# Patient Record
Sex: Male | Born: 1945 | Race: Black or African American | Hispanic: No | Marital: Married | Smoking: Former smoker
Health system: Southern US, Community
[De-identification: ages and names within clinical notes are randomized; demographics above are authoritative.]

## PROBLEM LIST (undated history)

## (undated) DIAGNOSIS — J9610 Chronic respiratory failure, unspecified whether with hypoxia or hypercapnia: Secondary | ICD-10-CM

## (undated) DIAGNOSIS — J449 Chronic obstructive pulmonary disease, unspecified: Secondary | ICD-10-CM

## (undated) DIAGNOSIS — K219 Gastro-esophageal reflux disease without esophagitis: Secondary | ICD-10-CM

## (undated) DIAGNOSIS — H409 Unspecified glaucoma: Secondary | ICD-10-CM

## (undated) DIAGNOSIS — I10 Essential (primary) hypertension: Secondary | ICD-10-CM

## (undated) DIAGNOSIS — I509 Heart failure, unspecified: Secondary | ICD-10-CM

## (undated) HISTORY — PX: JOINT REPLACEMENT: SHX530

## (undated) HISTORY — PX: TOTAL KNEE ARTHROPLASTY: SHX125

---

## 2004-04-26 ENCOUNTER — Emergency Department: Payer: Self-pay | Admitting: Unknown Physician Specialty

## 2004-07-08 ENCOUNTER — Other Ambulatory Visit: Payer: Self-pay

## 2004-08-20 ENCOUNTER — Ambulatory Visit: Payer: Self-pay | Admitting: Orthopedic Surgery

## 2005-04-09 ENCOUNTER — Ambulatory Visit: Payer: Self-pay | Admitting: Internal Medicine

## 2005-05-05 ENCOUNTER — Inpatient Hospital Stay: Payer: Self-pay | Admitting: Orthopedic Surgery

## 2006-10-26 ENCOUNTER — Ambulatory Visit: Payer: Self-pay | Admitting: Cardiovascular Disease

## 2007-10-27 ENCOUNTER — Ambulatory Visit: Payer: Self-pay | Admitting: Family Medicine

## 2007-12-01 ENCOUNTER — Ambulatory Visit: Payer: Self-pay | Admitting: Orthopedic Surgery

## 2007-12-05 ENCOUNTER — Ambulatory Visit: Payer: Self-pay | Admitting: Orthopedic Surgery

## 2011-05-14 ENCOUNTER — Inpatient Hospital Stay: Payer: Self-pay | Admitting: Internal Medicine

## 2011-05-28 ENCOUNTER — Ambulatory Visit: Payer: Self-pay | Admitting: Internal Medicine

## 2012-05-31 ENCOUNTER — Ambulatory Visit: Payer: Self-pay | Admitting: Family Medicine

## 2012-06-15 ENCOUNTER — Ambulatory Visit: Payer: Self-pay | Admitting: Family Medicine

## 2012-07-08 ENCOUNTER — Ambulatory Visit: Payer: Self-pay | Admitting: Internal Medicine

## 2013-04-04 ENCOUNTER — Emergency Department: Payer: Self-pay | Admitting: Emergency Medicine

## 2013-04-05 ENCOUNTER — Emergency Department: Payer: Self-pay

## 2013-04-05 LAB — COMPREHENSIVE METABOLIC PANEL
Albumin: 3.6 g/dL (ref 3.4–5.0)
Alkaline Phosphatase: 97 U/L (ref 50–136)
BUN: 11 mg/dL (ref 7–18)
Bilirubin,Total: 0.3 mg/dL (ref 0.2–1.0)
Calcium, Total: 9.5 mg/dL (ref 8.5–10.1)
Chloride: 103 mmol/L (ref 98–107)
Creatinine: 1.02 mg/dL (ref 0.60–1.30)
EGFR (African American): >60
EGFR (Non-African Amer.): >60
Glucose: 103 mg/dL — ABNORMAL HIGH (ref 65–99)
Potassium: 4.4 mmol/L (ref 3.5–5.1)
SGOT(AST): 17 U/L (ref 15–37)
SGPT (ALT): 23 U/L (ref 12–78)
Total Protein: 7.7 g/dL (ref 6.4–8.2)

## 2013-04-05 LAB — CBC WITH DIFFERENTIAL/PLATELET
Basophil #: 0.1 10*3/uL (ref 0.0–0.1)
Basophil %: 1 %
Lymphocyte %: 23.1 %
MCV: 86 fL (ref 80–100)
Monocyte #: 0.8 x10 3/mm (ref 0.2–1.0)
Monocyte %: 13.1 %
WBC: 6.3 10*3/uL (ref 3.8–10.6)

## 2013-04-05 LAB — TSH: Thyroid Stimulating Horm: 1.37 u[IU]/mL

## 2013-06-27 ENCOUNTER — Ambulatory Visit: Payer: Self-pay | Admitting: Family Medicine

## 2013-07-24 ENCOUNTER — Ambulatory Visit: Payer: Self-pay | Admitting: Specialist

## 2013-08-24 ENCOUNTER — Inpatient Hospital Stay: Payer: Self-pay | Admitting: Specialist

## 2013-08-24 LAB — COMPREHENSIVE METABOLIC PANEL
ALBUMIN: 3.3 g/dL — AB (ref 3.4–5.0)
Alkaline Phosphatase: 74 U/L
BUN: 7 mg/dL (ref 7–18)
Bilirubin,Total: 0.5 mg/dL (ref 0.2–1.0)
CREATININE: 0.83 mg/dL (ref 0.60–1.30)
Calcium, Total: 8.4 mg/dL — ABNORMAL LOW (ref 8.5–10.1)
Chloride: 99 mmol/L (ref 98–107)
Co2: 39 mmol/L — ABNORMAL HIGH (ref 21–32)
EGFR (Non-African Amer.): >60
Glucose: 101 mg/dL — ABNORMAL HIGH (ref 65–99)
Osmolality: 272 (ref 275–301)
POTASSIUM: 4 mmol/L (ref 3.5–5.1)
SGOT(AST): 23 U/L (ref 15–37)
SGPT (ALT): 15 U/L (ref 12–78)
Sodium: 137 mmol/L (ref 136–145)
TOTAL PROTEIN: 7.4 g/dL (ref 6.4–8.2)

## 2013-08-24 LAB — CBC
HCT: 37.9 % — ABNORMAL LOW (ref 40.0–52.0)
HGB: 11.9 g/dL — ABNORMAL LOW (ref 13.0–18.0)
MCH: 27.3 pg (ref 26.0–34.0)
MCHC: 31.4 g/dL — ABNORMAL LOW (ref 32.0–36.0)
MCV: 87 fL (ref 80–100)
PLATELETS: 205 10*3/uL (ref 150–440)
RBC: 4.36 10*6/uL — ABNORMAL LOW (ref 4.40–5.90)
RDW: 14.9 % — AB (ref 11.5–14.5)
WBC: 5.3 10*3/uL (ref 3.8–10.6)

## 2013-08-24 LAB — CK TOTAL AND CKMB (NOT AT ARMC)
CK, Total: 131 U/L
CK-MB: 2.2 ng/mL (ref 0.5–3.6)

## 2013-08-24 LAB — TROPONIN I
TROPONIN-I: 0.2 ng/mL — AB
Troponin-I: 0.2 ng/mL — ABNORMAL HIGH
Troponin-I: 0.2 ng/mL — ABNORMAL HIGH

## 2013-08-24 LAB — PRO B NATRIURETIC PEPTIDE: B-TYPE NATIURETIC PEPTID: 625 pg/mL — AB (ref 0–125)

## 2013-08-25 LAB — CBC WITH DIFFERENTIAL/PLATELET
BASOS ABS: 0 10*3/uL (ref 0.0–0.1)
Basophil %: 0.7 %
EOS ABS: 0 10*3/uL (ref 0.0–0.7)
Eosinophil %: 0 %
HCT: 41.2 % (ref 40.0–52.0)
HGB: 12.8 g/dL — ABNORMAL LOW (ref 13.0–18.0)
LYMPHS ABS: 0.5 10*3/uL — AB (ref 1.0–3.6)
Lymphocyte %: 8.2 %
MCH: 26.9 pg (ref 26.0–34.0)
MCHC: 31 g/dL — AB (ref 32.0–36.0)
MCV: 87 fL (ref 80–100)
MONO ABS: 0.1 x10 3/mm — AB (ref 0.2–1.0)
MONOS PCT: 1 %
Neutrophil #: 5.2 10*3/uL (ref 1.4–6.5)
Neutrophil %: 90.1 %
PLATELETS: 254 10*3/uL (ref 150–440)
RBC: 4.75 10*6/uL (ref 4.40–5.90)
RDW: 14.8 % — ABNORMAL HIGH (ref 11.5–14.5)
WBC: 5.8 10*3/uL (ref 3.8–10.6)

## 2013-08-25 LAB — BASIC METABOLIC PANEL
Anion Gap: 2 — ABNORMAL LOW (ref 7–16)
BUN: 11 mg/dL (ref 7–18)
CALCIUM: 8.9 mg/dL (ref 8.5–10.1)
Chloride: 97 mmol/L — ABNORMAL LOW (ref 98–107)
Co2: 38 mmol/L — ABNORMAL HIGH (ref 21–32)
Creatinine: 0.89 mg/dL (ref 0.60–1.30)
EGFR (African American): >60
EGFR (Non-African Amer.): >60
Glucose: 171 mg/dL — ABNORMAL HIGH (ref 65–99)
Osmolality: 277 (ref 275–301)
POTASSIUM: 4.3 mmol/L (ref 3.5–5.1)
Sodium: 137 mmol/L (ref 136–145)

## 2013-08-29 LAB — CULTURE, BLOOD (SINGLE)

## 2013-08-30 LAB — EXPECTORATED SPUTUM ASSESSMENT W GRAM STAIN, RFLX TO RESP C

## 2013-09-27 ENCOUNTER — Ambulatory Visit: Payer: Self-pay | Admitting: Gastroenterology

## 2013-10-06 LAB — PATHOLOGY REPORT

## 2013-12-17 ENCOUNTER — Emergency Department: Payer: Self-pay | Admitting: Emergency Medicine

## 2013-12-17 LAB — URINALYSIS, COMPLETE
BACTERIA: NONE SEEN
Bilirubin,UR: NEGATIVE
Blood: NEGATIVE
GLUCOSE, UR: NEGATIVE mg/dL (ref 0–75)
Ketone: NEGATIVE
Leukocyte Esterase: NEGATIVE
NITRITE: NEGATIVE
PH: 8 (ref 4.5–8.0)
PROTEIN: NEGATIVE
RBC,UR: 2 /HPF (ref 0–5)
Specific Gravity: 1.006 (ref 1.003–1.030)
WBC UR: <1 /HPF (ref 0–5)

## 2013-12-17 LAB — COMPREHENSIVE METABOLIC PANEL
ANION GAP: 4 — AB (ref 7–16)
AST: 14 U/L — AB (ref 15–37)
Albumin: 3.5 g/dL (ref 3.4–5.0)
Alkaline Phosphatase: 69 U/L
BUN: 11 mg/dL (ref 7–18)
Bilirubin,Total: 0.5 mg/dL (ref 0.2–1.0)
CO2: 34 mmol/L — AB (ref 21–32)
CREATININE: 0.8 mg/dL (ref 0.60–1.30)
Calcium, Total: 8.8 mg/dL (ref 8.5–10.1)
Chloride: 102 mmol/L (ref 98–107)
EGFR (African American): >60
EGFR (Non-African Amer.): >60
Glucose: 101 mg/dL — ABNORMAL HIGH (ref 65–99)
Osmolality: 279 (ref 275–301)
Potassium: 4 mmol/L (ref 3.5–5.1)
SGPT (ALT): 16 U/L (ref 12–78)
SODIUM: 140 mmol/L (ref 136–145)
Total Protein: 7.3 g/dL (ref 6.4–8.2)

## 2013-12-17 LAB — CBC
HCT: 41.5 % (ref 40.0–52.0)
HGB: 13.1 g/dL (ref 13.0–18.0)
MCH: 27.2 pg (ref 26.0–34.0)
MCHC: 31.6 g/dL — AB (ref 32.0–36.0)
MCV: 86 fL (ref 80–100)
PLATELETS: 204 10*3/uL (ref 150–440)
RBC: 4.84 10*6/uL (ref 4.40–5.90)
RDW: 14.3 % (ref 11.5–14.5)
WBC: 4.5 10*3/uL (ref 3.8–10.6)

## 2013-12-17 LAB — PROTIME-INR
INR: 1
Prothrombin Time: 13.6 secs (ref 11.5–14.7)

## 2013-12-17 LAB — LIPASE, BLOOD: Lipase: 182 U/L (ref 73–393)

## 2014-08-06 ENCOUNTER — Emergency Department: Payer: Self-pay | Admitting: Emergency Medicine

## 2014-08-06 LAB — BASIC METABOLIC PANEL
Anion Gap: 3 — ABNORMAL LOW (ref 7–16)
BUN: 11 mg/dL (ref 7–18)
Calcium, Total: 8.4 mg/dL — ABNORMAL LOW (ref 8.5–10.1)
Chloride: 101 mmol/L (ref 98–107)
Co2: 38 mmol/L — ABNORMAL HIGH (ref 21–32)
Creatinine: 0.84 mg/dL (ref 0.60–1.30)
EGFR (African American): >60
GLUCOSE: 98 mg/dL (ref 65–99)
OSMOLALITY: 282 (ref 275–301)
Potassium: 4.4 mmol/L (ref 3.5–5.1)
Sodium: 142 mmol/L (ref 136–145)

## 2014-08-06 LAB — CBC
HCT: 40.1 % (ref 40.0–52.0)
HGB: 12.3 g/dL — ABNORMAL LOW (ref 13.0–18.0)
MCH: 27.2 pg (ref 26.0–34.0)
MCHC: 30.8 g/dL — AB (ref 32.0–36.0)
MCV: 88 fL (ref 80–100)
PLATELETS: 218 10*3/uL (ref 150–440)
RBC: 4.54 10*6/uL (ref 4.40–5.90)
RDW: 15.1 % — ABNORMAL HIGH (ref 11.5–14.5)
WBC: 6 10*3/uL (ref 3.8–10.6)

## 2014-08-06 LAB — TROPONIN I: TROPONIN-I: 0.18 ng/mL — AB

## 2014-08-26 LAB — BASIC METABOLIC PANEL
ANION GAP: 2 — AB (ref 7–16)
BUN: 14 mg/dL (ref 7–18)
CREATININE: 0.84 mg/dL (ref 0.60–1.30)
Calcium, Total: 9 mg/dL (ref 8.5–10.1)
Chloride: 98 mmol/L (ref 98–107)
Co2: 39 mmol/L — ABNORMAL HIGH (ref 21–32)
EGFR (Non-African Amer.): >60
Glucose: 86 mg/dL (ref 65–99)
Osmolality: 277 (ref 275–301)
Potassium: 4.3 mmol/L (ref 3.5–5.1)
Sodium: 139 mmol/L (ref 136–145)

## 2014-08-26 LAB — CBC
HCT: 41.1 % (ref 40.0–52.0)
HGB: 12.8 g/dL — AB (ref 13.0–18.0)
MCH: 27 pg (ref 26.0–34.0)
MCHC: 31 g/dL — ABNORMAL LOW (ref 32.0–36.0)
MCV: 87 fL (ref 80–100)
Platelet: 213 10*3/uL (ref 150–440)
RBC: 4.72 10*6/uL (ref 4.40–5.90)
RDW: 14.7 % — ABNORMAL HIGH (ref 11.5–14.5)
WBC: 6.1 10*3/uL (ref 3.8–10.6)

## 2014-08-26 LAB — PRO B NATRIURETIC PEPTIDE: B-TYPE NATIURETIC PEPTID: 510 pg/mL — AB (ref 0–125)

## 2014-08-26 LAB — TROPONIN I: Troponin-I: 0.19 ng/mL — ABNORMAL HIGH

## 2014-08-27 ENCOUNTER — Observation Stay: Payer: Self-pay | Admitting: Internal Medicine

## 2014-08-27 LAB — TROPONIN I
TROPONIN-I: 0.18 ng/mL — AB
Troponin-I: 0.18 ng/mL — ABNORMAL HIGH

## 2014-08-27 LAB — HEMOGLOBIN A1C: HEMOGLOBIN A1C: 6.4 % — AB (ref 4.2–6.3)

## 2014-11-10 NOTE — H&P (Signed)
PATIENT NAME:  Manuel Murphy, LAMPING MR#:  [id number] DATE OF BIRTH:  1945-09-16  DATE OF ADMISSION:  08/24/2013  PRIMARY CARE PHYSICIAN:  At [facility].   CHIEF COMPLAINT:  Shortness of breath and cough.   HISTORY OF PRESENT ILLNESS:  This is a 69 year old male who presents to the hospital due to shortness of breath, cough progressively getting worse over the past week to 10 days. The patient says that his breathing first was worse with exertion but now it is also at rest. The patient admits to a cough, which is productive with clear sputum. He denies any fevers, admits to some chills but no nausea, no vomiting. No chest pain. No abdominal pain. No diarrhea. No other associated symptoms. The patient went to see his primary care physician at [facility]; they referred him to the ER for further evaluation as the patient was having significant wheezing bilaterally. In the Emergency Room, the patient was noted to have some COPD exacerbation along with possible underlying bronchitis and pneumonia. He also was noted to have elevated troponin at 0.6. Hospitalist services were contacted for further treatment and evaluation.   REVIEW OF SYSTEMS: CONSTITUTIONAL:  No documented fever. No weight gain or weight loss.  EYES:  No blurred or double vision.  ENT:  No tinnitus or postnasal drip. No redness of the oropharynx.  RESPIRATORY:  Positive cough. Positive wheeze. No hemoptysis. Positive COPD.  CARDIOVASCULAR:  No chest pain. No orthopnea, no palpitations, no syncope.  GASTROINTESTINAL:  No nausea. No vomiting. No diarrhea. No abdominal pain. No melena or hematochezia.  GENITOURINARY:  No dysuria or hematuria.  ENDOCRINE:  No polyuria or nocturia. Heat or cold intolerance.  HEMATOLOGIC:  No anemia. No bruising. No bleeding.  INTEGUMENTARY:  No rashes. No lesions.  MUSCULOSKELETAL:  No arthritis. No swelling. No gout.  NEUROLOGIC:  No numbness or tingling. No ataxia. No seizure activity.  PSYCHIATRIC:  No  anxiety, no insomnia. No ADD.   PAST MEDICAL HISTORY:  Consistent with COPD, oxygen dependent, hypertension, glaucoma, a history of CHF, hyperlipidemia.   ALLERGIES:  MORPHINE.   SOCIAL HISTORY:  Used to be a smoker, quit about 10+ years ago. Does have a 30 to 40 pack-year smoking history. No alcohol abuse. No illicit drug abuse. Lives at home with his wife.   FAMILY HISTORY:  Both mother and father are deceased. Mother died from complications of heart disease and MRI. Father died from complications of congestive heart failure.   CURRENT MEDICATIONS:  As follows:  1.  Advair 250/50 1 puff b.i.d. 2.  Aspirin 81 mg daily.  3.  Atorvastatin 40 mg daily.  4.  Brimonidine eye drops 0.15 ophthalmic solution b.i.d. 5.  Cetirizine 10 mg daily.  6.  Combivent 1 puff q.6 hours.  7.  Imdur 30 mg daily.  8.  Lasix 40 mg daily.  9.  Latanoprost eye drops 1 drop to each eye at bedtime 10.  ProAir, albuterol inhaler 2 puffs q.4 hours as needed.  11.  Quinapril 40 mg b.i.d. 12.  Spiriva 1 puff daily. 13.  Travatan 0.004% ophthalmic solution to the affected eye daily in the evening.   PHYSICAL EXAMINATION: VITAL SIGNS:  Temperature 98.6, pulse 74, respirations 18, blood pressure 119/74, sats 96% on 2 L nasal cannula.  GENERAL:  He is pleasant-appearing male in no apparent distress.  HEENT:  Atraumatic, normocephalic. Extraocular muscles are intact. Pupils are equal and reactive to light. Sclerae anicteric. No conjunctival injection. No pharyngeal erythema.  NECK:  Supple. No jugular venous distention. No bruits. No lymphadenopathy or thyromegaly.  HEART:  Regular rate and rhythm. No murmurs, no rubs, no clicks.  LUNGS:  Diffuse coarse wheezing bilaterally, also diffuse rhonchi bilaterally. Negative use of accessory muscles. No dullness to percussion.  ABDOMEN:  Soft, flat, nontender, nondistended. Has good bowel sounds. No hepatosplenomegaly appreciated.  EXTREMITIES:  No evidence of any cyanosis,  clubbing or peripheral edema. Has +2 pedal and radial pulses bilaterally.  NEUROLOGICAL:  Alert, awake, and oriented x 3 with no focal motor or sensory deficits appreciated bilaterally.  SKIN:  Moist and warm with no rashes appreciated.  LYMPHATIC:  No cervical or axillary lymphadenopathy.   LABORATORY DATA:  Serum glucose of 101, BUN 7, creatinine 0.8, sodium 137, potassium 4.0, chloride 99, bicarb 39. LFTs were within normal limits. Troponin 0.2. White cell count 5.3, hemoglobin 11.9, hematocrit 37.9, platelet count 205. The patient had a chest x-ray done, which showed diffuse peribronchial cuffing which may suggest bronchitis. In addition, there is ill-defined right mid lung opacity, which may reflect developing airspace consolidation in the right upper lobe or superior segment of the right lower lobe.   ASSESSMENT AND PLAN:  This is a 69 year old male with chronic obstructive pulmonary disease, hypertension, glaucoma, a history of congestive heart failure, hyperlipidemia who presents to the hospital due to shortness of breath and cough progressively getting worse over the past 10 days to 2 weeks and noted to be in chronic obstructive pulmonary disease exacerbation.  1.  Chronic obstructive pulmonary disease exacerbation. This is likely the cause of the patient's progressive shortness of breath and cough. This is likely secondary to underlying acute bronchitis/early pneumonia. I will start treatment with IV steroids, around-the-clock nebulizer treatments, continue his Advair and Spiriva, start him on empiric ceftriaxone and , follow sputum and blood cultures. The patient already is on home oxygen.  2.  Acute bronchitis/pneumonia as seen on chest x-ray done on admission. I will treat him with IV ceftriaxone and Zithromax and follow sputum and blood cultures.  3.  Elevated troponin. This is likely demand ischemia from chronic obstructive pulmonary disease and hypoxia. The patient is active. No chest  pain. No EKG changes. We will observe on telemetry, follow serial cardiac enzymes, continue aspirin and statin.  4.  Hyperlipidemia. We will continue atorvastatin.  5.  Glaucoma. Continue Travatan, latanoprost and brimonidine eye drops.   CODE STATUS:  The patient is a FULL CODE.   TIME SPENT:  50 minutes.   ____________________________ Belia Heman. Verdell Carmine, MD vjs:jm D: 08/24/2013 12:20:41 ET T: 08/24/2013 12:42:07 ET JOB#: [id number]  cc: Belia Heman. Verdell Carmine, MD, <Dictator> Henreitta Leber MD ELECTRONICALLY SIGNED 09/02/2013 17:54

## 2014-11-10 NOTE — Discharge Summary (Signed)
PATIENT NAME:  Manuel Murphy, Manuel Murphy MR#:  [id number] DATE OF BIRTH:  [birth year]  DATE OF ADMISSION:  08/24/2013 DATE OF DISCHARGE:  08/26/2013   ADMISSION DIAGNOSIS: Acute bronchitis.  DISCHARGE DIAGNOSES: 1. Acute chronic obstructive pulmonary disease exacerbation.  2. Community-acquired pneumonia.  3. Elevated troponin.  4. Hyperlipidemia.  5. Glaucoma.  6. Gastroesophageal reflux disease.   CONSULTATIONS: Speech.   PERTINENT LABORATORIES AT DISCHARGE: Blood cultures were negative to date. White blood cells 5.8, hemoglobin 12.8, hematocrit 41, platelets of 254. Sodium 137, potassium 4.3, chloride 97, bicarbonate 38, BUN 11, creatinine 0.89, glucose is 171. Troponins x3 were 0.20.   HOSPITAL COURSE: A 69 year old male who presented with shortness of breath, found to have acute chronic obstructive pulmonary disease exacerbation and also a right middle lobe pneumonia. For further details, please refer to the H and P.   1. Acute chronic obstructive pulmonary disease exacerbation, likely secondary to community-acquired pneumonia. The patient was started on IV steroids, inhalers, nebulizers. He is on oxygen at home, on which he was continued. He is actually doing quite well. At discharge, he has some minimal crackles at the right base, but no wheezing and good airflow. He will remain on his oxygen, antibiotic and inhalers.  2. Community-acquired pneumonia. This is a right middle lobe pneumonia, so I got speech therapy in consultation, and they thought that maybe he had some reflux issues contributing to his pneumonia, and it was recommended that he have an outpatient barium test to evaluate for reflux. This can be arranged by his primary care physician. He is placed on a PPI as well as antibiotics for his pneumonia. From a pneumonia perspective, he is doing well. He has some minimal crackles at the right lung base.  3. Gastroesophageal reflux disease. The patient is now on ranitidine. Again, we asked the  primary care physician to order a barium evaluation or an EGD for further evaluation for his reflux as this may be contributing to ongoing pneumonia.  4. Elevated troponin secondary to demand ischemia. He had an echocardiogram in 2012 which showed a normal ejection fraction. He has seen Dr. Clayborn Bigness in the past. We recommend outpatient followup with Dr. Clayborn Bigness. He would benefit probably from an outpatient stress test at some point.  5. Hyperlipidemia, on atorvastatin.  6. Glaucoma. The patient is to continue his eyedrops.   DISCHARGE MEDICATIONS:  1. Levaquin 750 mg q.24 hours for 7 days.  2. Lasix 40 mg daily.  3. Travatan 1 drop each eye daily.  4. Combivent 1 puff q.6 hours p.r.n.  5. Advair Diskus 250/50 b.i.d.  6. Brimonidine ophthalmic 0.15% b.i.d.  7. Latanoprost ophthalmic 0.005% 1 drop to each affected eye at bedtime.  8. Aspirin 81 mg daily.  9. Cetirizine 10 mg daily. 10. Spiriva 18 mcg daily.  11. Quinapril 40 mg b.i.d.  12. Imdur 30 mg daily.  13. Atorvastatin 20 mg daily.  14. ProAir 2 puffs 4 times a day.  15. Prednisone taper starting at 60 mg, taper by 10 mg every 3 days.  16. Ranitidine 300 mg daily.  DISCHARGE OXYGEN: 2 liters nasal cannula.    DISCHARGE DIET: Regular diet.   DISCHARGE ACTIVITY: As tolerated.   DISCHARGE FOLLOWUP: The patient will follow up with Dr. Dema Severin in 1 week as well as Dr. Clayborn Bigness. Again, I recommend a barium study for evaluation for reflux as well as an outpatient stress test.   DISCHARGE CONDITION: The patient is medically stable for discharge.   TIME SPENT: Approximately 35  minutes.   ____________________________ Manuel Murphy. Benjie Karvonen, MD spm:lb D: 08/26/2013 11:54:05 ET T: 08/26/2013 13:31:01 ET JOB#: [id number]  cc: Shahrzad Koble P. Benjie Karvonen, MD, <Dictator> Dani Gobble. White, FNP Dwayne D. Clayborn Bigness, MD Manuel Murphy Manuel Eskridge MD ELECTRONICALLY SIGNED 08/26/2013 15:37

## 2014-11-18 NOTE — Consult Note (Signed)
PATIENT NAME:  Manuel Murphy, Manuel Murphy MR#:  [id number] DATE OF BIRTH:  18-Jun-1946  DATE OF CONSULTATION:  08/27/2014  REFERRING PHYSICIAN:  Juluis Mire, MD CONSULTING PHYSICIAN:  Terri Malerba D. Clayborn Bigness, MD  PRIMARY PHYSICIAN: [facility].  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 He was trying to get an appointment in his clinic but was unable to get one soon enough. Reported his family noticed some slurred speech intermittently. No real numbness. No swallowing difficulty. No significant headaches. He denies any chest pain or worsening shortness of breath or urinary symptoms. In the Emergency Room, he was treated for elevated blood pressure. Reportedly had blood pressure controlled down to 150 in the Emergency Room. Troponin was elevated at 0.19, so he was admitted for further evaluation and care.   PAST MEDICAL HISTORY: Hypertension, diabetes diet controlled, COPD, congestive heart failure, history of myocardial infarction, hyperlipidemia, GERD, glaucoma.   PAST SURGICAL HISTORY: Total knee replacement, left eye surgery.   ALLERGIES: MORPHINE.   MEDICATIONS: Accupril 40 a day, aspirin 81 mg a day, atorvastatin 40 a day, brimonidine ophthalmic drops 0.15% at bedtime, Combivent as needed, Lasix 40 mg once a day, metoprolol 50 mg twice a day, ProAir 2 puffs q. 4 p.r.n., Symbicort 160 two puffs as needed, vitamin D once a day, latanoprost 0.005 drops to each eye once a day.   SOCIAL HISTORY:  History of smoking in the past but quit about 10 years ago. He is married, lives with his wife.   FAMILY HISTORY: Mother died of heart disease. Father died of congestive heart failure.   REVIEW OF SYSTEMS: Denies blackout spells or syncope. No significant nausea or vomiting. No fever, no chills. No sweating. No weight loss. No weight gain, hemoptysis, hematemesis. No bright red blood per rectum. No vision change or hearing change. Denies sputum production or cough. He has had some mild shortness of breath. He has had elevated blood pressure and palpitations.   PHYSICAL EXAMINATION:  VITAL SIGNS: Blood pressure was 200/100; at home he says blood pressure was 270. Respiratory rate of 16, afebrile.  HEENT: Normocephalic, atraumatic. Pupils reactive to light.  NECK: Supple. No significant JVD, bruits or adenopathy.  LUNGS: Clear to auscultation and percussion.  wheezing, rhonchi, or rale.  HEART: Revealed positive S4, systolic ejection murmur in the apex  EXTREMITIES: Within normal limits.  NEUROLOGIC: Intact.  SKIN: Normal.   LABORATORIES: Glucose of 86. BNP 500. BUN 14, creatinine 0.8, sodium 139, potassium 4.3, chloride of 98, CO2 of 39, calcium of 9.0. Troponin 0.19. White count of 6, hemoglobin of 12.8, hematocrit of 41, platelet count of 213,000.   Chest x-ray basically unremarkable. CT of the head unremarkable.   EKG: Sinus bradycardia rate of 60, nonspecific ST-T wave changes, LVH.   ASSESSMENT: Hypertensive urgency, possible transient ischemic attack, elevated troponins mildly, chronic obstructive pulmonary disease, diabetes, congestive heart failure, hyperlipidemia, reflux, history of glaucoma.   PLAN:  1.  Agree with admit. Control blood pressure aggressively as necessary. Place on telemetry. Recommend further evaluation with CT of the  head, MRI of the brain for TIA symptoms.  2.  Follow up troponins and EKGs for possible potential cardiac event, but this is probably demand ischemia.   3.  For COPD, continue inhalers as necessary and supplemental oxygen.  4.  For diabetes, continue diet control. Follow up hemoglobin A1c and fasting sugars.  5.  Congestive heart failure. Continue blood pressure control with Lasix and ACE inhibitor. Hold off on further beta blockers because of bradycardia. 6.  Hyperlipidemia. Continue statin. 7.  For GERD, continue proton pump inhibitor.  8.  Recommend continued eyedrops for glaucoma.  We will continue to follow the patient. Once the blood pressure is adequately controlled, have this patient follow up as an outpatient.   ____________________________ Loran Senters. Clayborn Bigness, MD ddc:TM D: 08/27/2014 13:57:00 ET T: 08/27/2014 15:23:52 ET JOB#: [id number]  cc: Abbygayle Helfand D. Clayborn Bigness, MD, <Dictator> Yolonda Kida MD ELECTRONICALLY SIGNED 08/28/2014 17:58

## 2014-11-18 NOTE — Discharge Summary (Signed)
PATIENT NAME:  Manuel Murphy, Manuel Murphy MR#:  [id number] DATE OF BIRTH:  10/12/1945  DATE OF ADMISSION:  08/27/2014 DATE OF DISCHARGE:  08/28/2014  DISCHARGE DIAGNOSES:   1.  Hypertensive urgency.   2.  Slurred speech secondary to hypertensive urgency.   3.  Elevated troponin secondary to hypertensive urgency.    DISCHARGE MEDICATIONS:   1. Lasix 40 mg p.o. daily.  2. Combivent Respimat 1 puff q. 6 hours.  3. Bromindione eyedrops 1 drop in affected eye twice a day.  4. Aspirin 81 mg daily.  5. Atorvastatin 40 mg p.o. daily.  6. Latanoprost eyedrops 0.005 one drop in each eye once a day.  7. Symbicort 160/4.5 mcg 2 puffs b.i.d.  8. ProAir 2 puffs 4 times daily.  9. Metoprolol tartrate 50 mg p.o. b.i.d.  10. Vitamin D 1000 units once a day.  11. Quinapril 40 mg p.o. daily.   12. Imdur 30 mg p.o. daily.   DIET: Low-sodium, low-fat diet.   CONSULTATIONS: Cardiology consult with Dr. Clayborn Bigness.   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 The patient has a history of diabetes diet controlled, COPD, came in because of elevated blood pressure, systolic above 106 at home. The patient brought in because of the uncontrolled hypertension. Troponins were 0.19. Admitted for that.  The patient also had some slurred speech noted by the family member.   1.  Regarding uncontrolled hypertension and chest pain, the patient was monitored on telemetry. The patient has been having this elevated blood pressure 270/110 however for about 2 weeks.  The patient continued on his medications and the patient's symptoms got better and the patient was given Lasix, metoprolol, quinapril, Imdur, and the patient's blood pressure has been stable with them and blood pressure today is 140/81, heart rate 57.    2.  Slurred speech. The patient had slurred speech noted by family members, seen by Dr. Irish Elders.  He had a normal CAT scan of the head and the patient's MRI of  the brain showed changes consistent with hypertensive changes rather than acute stroke or hemorrhage.  The patient's echocardiogram also was done which showed EF of 50-55% with normal LV function and increased left ventricle septal thickness. The patient's ultrasound of the carotids showed less than 50% stenosis bilaterally. The patient's LDL is at 81. His slurred speech improved. The patient has no further neurological deficit. A stroke has been ruled out. Dr. Irish Elders said the patient needs to have bp  controlled at systolic blood pressure less than 160. MRI of the brain was concerning for very small microbleed, but Dr. Irish Elders said the patient can be continued on aspirin 81 mg because of very small microbleed.  The patient is neurologically normal today. He was alert, awake, and oriented.  No weakness in hands or legs and DTR 2 + bilaterally.  The patient went home in stable condition.  3.  The patient has a history of COPD, I advised him to continue his inhalers, has no wheezing on clinical examination.  4.  Regarding his troponin elevation, I did discuss with Dr. Clayborn Bigness. The patient's troponin was 0.18 and the patient's serial troponin is 0.19 as well. Troponins results discussed with Dr. Clayborn Bigness, he said the patient can have outpatient stress test and he is not concerned about that because his blood pressure was high when he came. So he went home in stable condition.   TIME SPENT: More than 30 minutes.    Blood  pressure initially was 199/103 in the ER, but today it is 151/83. The patient went home in stable condition.  Time spent on discharge preparation more than 30 minutes.    PHYSICAL EXAMINATION:  CARDIOVASCULAR:  Today showed cardiovascular S1, S2 regular.  LUNGS: Clear to auscultation.  ABDOMEN: Soft, nontender, nondistended. BS present. NEUROLOGICAL: Alert, awake, oriented.  Cranial nerves II through XII intact. Power 5 out 5 in upper and lower extremities.  Sensation intact.  DTRs 2 +  bilaterally.    ____________________________ Epifanio Lesches, MD sk:bu D: 08/28/2014 14:14:31 ET T: 08/28/2014 14:53:42 ET JOB#: [id number]  cc: Epifanio Lesches, MD, <Dictator> Dwayne D. Clayborn Bigness, MD [facility]   Epifanio Lesches MD ELECTRONICALLY SIGNED 08/29/2014 14:16

## 2014-11-18 NOTE — H&P (Signed)
PATIENT NAME:  Manuel Murphy, Manuel Murphy MR#:  [id number] DATE OF BIRTH:  1945-08-23  DATE OF ADMISSION:  08/27/2014  REFERRING PHYSICIAN:  Loney Hering, MD  PRIMARY CARE PRACTITIONER:  [facility]  ADMITTING PHYSICIAN:  Juluis Mire, MD   CHIEF COMPLAINT: 1.  Elevated blood pressures with difficulty to control for the past 2 weeks with home blood pressure recording of 277/111.  2.  Slurred speech, intermittent, noted by his daughter of 67 day duration.   HISTORY OF PRESENT ILLNESS:  This 69 year old African-American male with a past medical history of hypertension, diet-controlled diabetes mellitus, COPD on home oxygen at 2 liters, history of congestive heart failure/MI, hyperlipidemia, gastroesophageal reflux disease, and glaucoma presents to the Emergency Room with the complaints of having difficulty in controlling his blood pressure for the past 2 weeks and noted to have elevated blood pressure of 277/111 at home yesterday. Hence, he came to the Emergency Room for further evaluation. The patient also has been noticing some increasing shortness of breath, which is not very acute. The patient was also noted to have some speech disturbances like slurred speech by his daughter intermittently since this morning, which has resolved completely at this time. No associated numbness or focal weakness. No swallowing difficulties. No visual disturbances. Denies any chest pain but is having some increasing shortness of breath and some mild cough, but no increased sputum production. No fever. No nausea. No vomiting. No diarrhea. No urinary symptoms such as dysuria, frequency, or urgency. As mentioned earlier, the patient noted his blood pressure as elevated with a reading of 277/111 at home. On arrival to the Emergency Room, the patient's blood pressure was 199/103. Subsequently, the blood pressure is improving and currently his blood pressure is maintaining around 150/81. Workup in the Emergency Room  revealed the labs to be essentially normal except for mildly elevated troponin of 0.19. Of note, the patient has chronically elevated troponin, and the last time he was admitted to this hospital in February 2015, his troponin was also mildly elevated. The patient underwent a CT scan of the head, which is a noncontrast study and is negative for any acute cerebrovascular disease. Chest x-ray obtained in the Emergency Room is negative for any acute cardiopulmonary disease.   PAST MEDICAL HISTORY: 1.  Hypertension.  2.  Diabetes mellitus, diet controlled.  3.  COPD on home oxygen at 2 liters per minute.  4.  History of congestive heart failure.  5.  History of MI/coronary artery disease.  6.  Hyperlipidemia.  7.  Gastroesophageal reflux disease.  8.  Glaucoma.   PAST SURGICAL HISTORY: 1.  Left total knee replacement. 2.  Left eye surgery.   ALLERGIES:  MORPHINE.   HOME MEDICATIONS: 1.  Accupril 40 mg tablet 1 tablet orally once a day.  2.  Aspirin 81 mg 1 tablet orally once a day.  3.  Atorvastatin 40 mg 1 tablet orally once a day.  4.  Brimonidine ophthalmic drops 0.15% one drop at bedtime every day.  5.  Combivent inhaler 100 mcg/20 mcg 1 puff every 6 hours as needed.  6.  Furosemide 40 mg tablet 1 tablet orally once a day.  7.  Latanoprost 0.005% ophthalmic solution one drop in each eye once a day.  8.  Metoprolol 50 mg 1 tablet orally 2 times a day.  9.  ProAir 90 mcg/inhalation 2 puffs every 4 to 6 hours as needed.  10.  Symbicort 160 mcg/4.5 mcg inhalation solution 2 puffs 2 times  a day.  11.  Vitamin D 1000 units 1 tablet daily once a day.   SOCIAL HISTORY:  History of smoking in the past for 30 to 40-year smoking history. He quit about 10 years ago. No history of alcohol or illicit drug usage. He is married and lives with his wife at home.    FAMILY HISTORY:  Mother died from complication of heart disease and father died from complication of congestive heart failure.    REVIEW  OF SYSTEMS: CONSTITUTIONAL:  Negative for fever or chills. No generalized weakness or fatigue.  EYES:  Negative for blurred vision or double vision. No pain. No redness. No discharge.  EARS, NOSE, AND THROAT:  Negative for tinnitus, ear pain, hearing loss, epistaxis, nasal discharge, or difficulty swallowing.  RESPIRATORY:  Mild cough, but negative for wheezing. No dyspnea. No hemoptysis. No painful respirations.  CARDIOVASCULAR:  Negative for chest pain, palpitations, dizziness, syncopal episodes, orthopnea, dyspnea on exertion, or pedal edema.  GASTROINTESTINAL:  Negative for nausea, vomiting, diarrhea, abdominal pain, hematemesis, melena, or rectal bleeding. Chronic GERD symptoms, stable on PPI.  GENITOURINARY:  Negative for dysuria, frequency, urgency, or hematuria.  ENDOCRINE:  Negative for polyuria, nocturia, or heat or cold intolerance.  HEMATOLOGIC AND LYMPHATIC:  Negative for anemia, easy bruising or bleeding, or swollen glands.  INTEGUMENTARY:  Negative for acne, skin rash, or lesions.  MUSCULOSKELETAL:  Negative for neck or back pain. No shoulder pain. No history of arthritis or gout.  NEUROLOGICAL:  Positive for intermittent episodes of slurred speech noted by his daughter, as mentioned in the history of present illness. Denies any focal weakness or numbness. No history of CVA, TIA, or seizure disorder in the past.  PSYCHIATRIC:  Negative for anxiety, insomnia, or depression.   PHYSICAL EXAMINATION: VITAL SIGNS:  On arrival to the Emergency Room, temperature 98.3 degrees Fahrenheit, pulse rate 52 per minute, respirations 20 per minute, blood pressure 199/103, oxygen saturation 93% on 2 liters of oxygen. Current vital signs:  Temperature 98.3 degrees Fahrenheit, pulse rate 54 per minute, respirations 18 per minute, blood pressure 150/81, oxygen saturation 99% on 2 liters of oxygen.  GENERAL:  Well developed, well nourished, alert and oriented, in no acute distress, comfortably resting on  the bed.  HEAD:  Atraumatic, normocephalic.  EYES:  Pupils are equal and reactive to light and accommodation. No conjunctival pallor. No icterus. Extraocular movements are intact.  NOSE:  No drainage. No lesions.  EARS:  No drainage. No external lesions.  ORAL CAVITY:  No mucosal lesions. No exudates.  NECK:  Supple. No JVD. No thyromegaly. No carotid bruit. Range of motion of the neck is within normal limits.  RESPIRATORY:  Good respiratory effort. Not using accessory muscles of respiration. Bilateral vesicular breath sounds. No rales or rhonchi.  CARDIOVASCULAR:  S1 and S2, regular. No murmurs, gallops, or clicks. Peripheral pulses are equal at carotid, femoral, and pedal pulses. No peripheral edema.  GASTROINTESTINAL:  Abdomen is soft and nontender. No hepatosplenomegaly. No masses. No rigidity. No guarding. Bowel sounds present and equal in all 4 quadrants.  GENITOURINARY:  Deferred.  MUSCULOSKELETAL:  No joint tenderness or effusion. Range of motion is adequate. Strength and tone are equal bilaterally.  SKIN:  Inspection is within normal limits. No obvious sores.  LYMPHATIC:  No cervical lymphadenopathy.  VASCULAR:  Good dorsalis pedis and posterior tibial pulses.  NEUROLOGICAL:  Alert, awake, and oriented x 3. Cranial nerves II through XII are grossly intact. No facial asymmetry. No sensory deficit. Motor  strength is 5/5 in both upper and lower extremities. DTRs are 2+ bilaterally and symmetrical. Plantars are downgoing on both sides.  PSYCHIATRIC:  Alert, awake, and oriented x 3. Judgment and insight are adequate. Memory and mood are within normal limits.   LABORATORY DATA:  Serum glucose is 86, BNP 510, BUN 14 creatinine 0.84, sodium 139, potassium 4.3, chloride 98, bicarbonate 39, and total calcium is 9.0. Troponin is 0.19. WBC is 6.1, hemoglobin 12.8, hematocrit 41.1, and platelet count is 213,000.   IMAGING STUDIES:  Chest x-ray is negative for any acute cardiopulmonary pathology.  Shallow inspiration with atelectasis in both of the lung bases.   CT of the head, noncontrast study:  Negative for acute intracranial pathology.   EKG:  Sinus bradycardia with ventricular rate of 59 beats per minute, nonspecific T-wave changes in the lateral leads.   ASSESSMENT AND PLAN:  This 70 year old African-American male with a past medical history of hypertension; diabetes mellitus, diet controlled; chronic obstructive pulmonary disease on home oxygen at 2 liters per minute; congestive heart failure; myocardial infarction; hyperlipidemia; gastroesophageal reflux disease; and history of glaucoma presents to the Emergency Room with the complaints of difficulty in controlling his blood pressure for the past 2 weeks and noted to have a blood pressure of 277/111 at home and also noted to have intermittent episodes of slurred speech this morning as noted by his daughter. No associated focal weakness or numbness. The patient is admitted for observation.   1.  Hypertensive urgency, history of hypertension. On presentation to the Emergency Room, blood pressure was elevated, but currently blood pressure is in reasonable range.  2.  Episodes of slurred speech, as noted by his daughter, which has resolved completely now. No associated focal weakness or numbness. Speech disturbance is concerning for transient ischemic attack, rule out cerebrovascular accident. CT of the head is negative for acute changes. Plan:  Admit to telemetry for observation. Continue home blood pressure medications and modify medications accordingly. Place him on neurological watch. Continue aspirin and statin. We will order MRI of the brain and carotid Dopplers to evaluate for cerebrovascular accident. We will order echocardiogram. Neurologic consultation and physical therapy and occupational therapy consultation requested.  3.  Mildly elevated troponin, seems chronic since troponin was also elevated during his prior admission in February  2015. No associated chest pain. No acute EKG changes. However, rule out any acute coronary syndrome in view of significant coronary artery disease risk factors. Plan:  Cycle cardiac enzymes. Continue aspirin, beta blocker, and statin. We will order echocardiogram with history of congestive heart failure and further workup accordingly.  4.  Chronic obstructive pulmonary disease on home oxygen, stable currently. We will continue home inhalers and continue DuoNebs and oxygen supplementation.  5.  Diabetes mellitus, diet controlled. Plan:  Monitor blood sugars and sliding scale insulin as needed.  6.  History of congestive heart failure, systolic versus diastolic. No acute symptoms at present. Plan:  Check echocardiogram. Continue Lasix and ACE inhibitors for now.  7.  Hyperlipidemia on statin, stable. Continue same. Check fasting lipids.  8.  Gastroesophageal reflux disease, stable on proton pump inhibitor. Continue same.  9.  History of glaucoma, stable on home medications. Continue same.  10.  Deep vein thrombosis prophylaxis with subcutaneous Lovenox.  11.  Gastrointestinal prophylaxis with proton pump inhibitor.   CODE STATUS:  Full code.   TIME SPENT:  55 minutes.   ____________________________ Juluis Mire, MD enr:nb D: 08/27/2014 02:49:57 ET T: 08/27/2014 03:48:44 ET  JOB#: [id number]  cc: Juluis Mire, MD, <Dictator> [place] MD ELECTRONICALLY SIGNED 08/27/2014 16:38

## 2014-11-18 NOTE — Consult Note (Signed)
PATIENT NAME:  Manuel Murphy, CYPERT MR#:  [id number] DATE OF BIRTH:  1946/06/20  DATE OF CONSULTATION:  08/27/2014  REFERRING PHYSICIAN:   CONSULTING PHYSICIAN:  Leotis Pain, MD  REASON FOR CONSULTATION:  Stroke-like symptoms. Slurred speech.   HISTORY OF PRESENT ILLNESS: This is a 69 year old gentleman with past medical history of hypertension, diabetes, that is not well-controlled, COPD on 2 liters home O2, congestive heart failure, status post multiple MIs, status post multiple stents presents with dysarthric speech, and blood pressure of 277/101.  As per the family, who is at bedside, the patient's speech has improved and is suspected close to baseline. CAT scan of the head was done. No acute intracranial abnormalities. No focal deficits at this time. MRI of the brain reviewed.   The patient has bilateral periventricular lesions as well as small  bleed.    PAST MEDICAL HISTORY: Hypertension, diabetes, chronic obstructive pulmonary disease, history of congestive heart failure, history of MI, hyperlipidemia, gastroesophageal reflux disease, and glaucoma.   PAST SURGICAL HISTORY: Left total knee replacement, left eye surgery.   ALLERGIES: MORPHINE.   HOME MEDICATIONS: Have been reviewed. The patient is on aspirin 81 mg daily and states he is compliant with it.    REVIEW OF SYSTEMS:  No shortness of breath, no chest pain, no abdominal pain.  No new weakness on one side of the body compared to the other. Positive for dysarthria that has improved.  No heat or cold intolerance. No anxiety or depression.    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  MRI described above, bilateral periventricular lesions thought the periventricular lesions could be in the setting of chronic ischemia or chronic carbon  monoxide poisoning.  The patient has a small  bleed which is a microbleed.    PLAN: Physical therapy, occupational therapy, blood pressure management, keep his blood pressure below is 160 due to the bleed.  The patient did receive his aspirin this morning.  Usually in these cases, I do start aspirin on the third day, so we will continue his aspirin 81 mg at this point due to a very small microbleed.  Likely discharge planning on the next day.  This case was discussed with family who is at bedside.   Thank you. It was a pleasure seeing this patient.    ____________________________ Leotis Pain, MD yz:DT D: 08/27/2014 16:21:30 ET T: 08/27/2014 16:49:17 ET JOB#: [id number]  cc: Leotis Pain, MD, <Dictator> Leotis Pain MD ELECTRONICALLY SIGNED 08/28/2014 12:12

## 2015-03-23 ENCOUNTER — Emergency Department: Payer: Medicare Other

## 2015-03-23 ENCOUNTER — Encounter: Payer: Self-pay | Admitting: Emergency Medicine

## 2015-03-23 ENCOUNTER — Inpatient Hospital Stay
Admission: EM | Admit: 2015-03-23 | Discharge: 2015-03-25 | DRG: 189 | Disposition: A | Discharge status: Home / Self Care | Payer: Medicare Other | Attending: Internal Medicine | Admitting: Internal Medicine

## 2015-03-23 DIAGNOSIS — K219 Gastro-esophageal reflux disease without esophagitis: Secondary | ICD-10-CM | POA: Diagnosis not present

## 2015-03-23 DIAGNOSIS — H409 Unspecified glaucoma: Secondary | ICD-10-CM | POA: Diagnosis not present

## 2015-03-23 DIAGNOSIS — I455 Other specified heart block: Secondary | ICD-10-CM

## 2015-03-23 DIAGNOSIS — Z825 Family history of asthma and other chronic lower respiratory diseases: Secondary | ICD-10-CM | POA: Diagnosis not present

## 2015-03-23 DIAGNOSIS — J9622 Acute and chronic respiratory failure with hypercapnia: Secondary | ICD-10-CM | POA: Diagnosis present

## 2015-03-23 DIAGNOSIS — Z96652 Presence of left artificial knee joint: Secondary | ICD-10-CM | POA: Diagnosis present

## 2015-03-23 DIAGNOSIS — Z7951 Long term (current) use of inhaled steroids: Secondary | ICD-10-CM

## 2015-03-23 DIAGNOSIS — J9621 Acute and chronic respiratory failure with hypoxia: Principal | ICD-10-CM | POA: Diagnosis present

## 2015-03-23 DIAGNOSIS — R001 Bradycardia, unspecified: Secondary | ICD-10-CM | POA: Diagnosis not present

## 2015-03-23 DIAGNOSIS — J9601 Acute respiratory failure with hypoxia: Secondary | ICD-10-CM

## 2015-03-23 DIAGNOSIS — Z9981 Dependence on supplemental oxygen: Secondary | ICD-10-CM

## 2015-03-23 DIAGNOSIS — J9602 Acute respiratory failure with hypercapnia: Secondary | ICD-10-CM

## 2015-03-23 DIAGNOSIS — Z7982 Long term (current) use of aspirin: Secondary | ICD-10-CM | POA: Diagnosis not present

## 2015-03-23 DIAGNOSIS — I2589 Other forms of chronic ischemic heart disease: Secondary | ICD-10-CM | POA: Diagnosis not present

## 2015-03-23 DIAGNOSIS — J441 Chronic obstructive pulmonary disease with (acute) exacerbation: Secondary | ICD-10-CM | POA: Diagnosis not present

## 2015-03-23 DIAGNOSIS — J449 Chronic obstructive pulmonary disease, unspecified: Secondary | ICD-10-CM | POA: Diagnosis present

## 2015-03-23 DIAGNOSIS — I5030 Unspecified diastolic (congestive) heart failure: Secondary | ICD-10-CM | POA: Diagnosis not present

## 2015-03-23 DIAGNOSIS — G4730 Sleep apnea, unspecified: Secondary | ICD-10-CM | POA: Diagnosis present

## 2015-03-23 DIAGNOSIS — Z8249 Family history of ischemic heart disease and other diseases of the circulatory system: Secondary | ICD-10-CM

## 2015-03-23 DIAGNOSIS — J410 Simple chronic bronchitis: Secondary | ICD-10-CM

## 2015-03-23 DIAGNOSIS — I10 Essential (primary) hypertension: Secondary | ICD-10-CM | POA: Diagnosis present

## 2015-03-23 DIAGNOSIS — R0602 Shortness of breath: Secondary | ICD-10-CM

## 2015-03-23 HISTORY — DX: Heart failure, unspecified: I50.9

## 2015-03-23 HISTORY — DX: Gastro-esophageal reflux disease without esophagitis: K21.9

## 2015-03-23 HISTORY — DX: Chronic obstructive pulmonary disease, unspecified: J44.9

## 2015-03-23 HISTORY — DX: Essential (primary) hypertension: I10

## 2015-03-23 HISTORY — DX: Chronic respiratory failure, unspecified whether with hypoxia or hypercapnia: J96.10

## 2015-03-23 HISTORY — DX: Unspecified glaucoma: H40.9

## 2015-03-23 LAB — CBC WITH DIFFERENTIAL/PLATELET
Basophils Absolute: 0 10*3/uL (ref 0–0.1)
Basophils Absolute: 0 10*3/uL (ref 0–0.1)
Basophils Relative: 1 %
Basophils Relative: 1 %
EOS ABS: 0 10*3/uL (ref 0–0.7)
Eosinophils Absolute: 0.1 10*3/uL (ref 0–0.7)
Eosinophils Relative: 1 %
Eosinophils Relative: 1 %
HCT: 38.1 % — ABNORMAL LOW (ref 40.0–52.0)
HEMATOCRIT: 39.1 % — AB (ref 40.0–52.0)
HEMOGLOBIN: 12.1 g/dL — AB (ref 13.0–18.0)
Hemoglobin: 12 g/dL — ABNORMAL LOW (ref 13.0–18.0)
LYMPHS ABS: 1.2 10*3/uL (ref 1.0–3.6)
LYMPHS PCT: 20 %
Lymphocytes Relative: 16 %
Lymphs Abs: 1.1 10*3/uL (ref 1.0–3.6)
MCH: 26.8 pg (ref 26.0–34.0)
MCH: 27 pg (ref 26.0–34.0)
MCHC: 31 g/dL — ABNORMAL LOW (ref 32.0–36.0)
MCHC: 31.4 g/dL — ABNORMAL LOW (ref 32.0–36.0)
MCV: 86.3 fL (ref 80.0–100.0)
MCV: 86.1 fL (ref 80.0–100.0)
Monocytes Absolute: 0.7 10*3/uL (ref 0.2–1.0)
Monocytes Absolute: 0.6 10*3/uL (ref 0.2–1.0)
Monocytes Relative: 11 %
Monocytes Relative: 9 %
NEUTROS ABS: 4.1 10*3/uL (ref 1.4–6.5)
NEUTROS PCT: 67 %
Neutro Abs: 5 10*3/uL (ref 1.4–6.5)
Neutrophils Relative %: 73 %
Platelets: 206 10*3/uL (ref 150–440)
Platelets: 188 10*3/uL (ref 150–440)
RBC: 4.53 MIL/uL (ref 4.40–5.90)
RBC: 4.42 MIL/uL (ref 4.40–5.90)
RDW: 15.2 % — ABNORMAL HIGH (ref 11.5–14.5)
RDW: 15.3 % — ABNORMAL HIGH (ref 11.5–14.5)
WBC: 6.1 10*3/uL (ref 3.8–10.6)
WBC: 6.9 10*3/uL (ref 3.8–10.6)

## 2015-03-23 LAB — COMPREHENSIVE METABOLIC PANEL
ALT: 17 U/L (ref 17–63)
AST: 18 U/L (ref 15–41)
Albumin: 3.8 g/dL (ref 3.5–5.0)
Alkaline Phosphatase: 63 U/L (ref 38–126)
Anion gap: 9 (ref 5–15)
BUN: 13 mg/dL (ref 6–20)
CO2: 41 mmol/L — ABNORMAL HIGH (ref 22–32)
Calcium: 8.7 mg/dL — ABNORMAL LOW (ref 8.9–10.3)
Chloride: 94 mmol/L — ABNORMAL LOW (ref 101–111)
Creatinine, Ser: 0.93 mg/dL (ref 0.61–1.24)
GFR calc Af Amer: >60 mL/min (ref >60)
GFR calc non Af Amer: >60 mL/min (ref >60)
Glucose, Bld: 88 mg/dL (ref 65–99)
Potassium: 4.2 mmol/L (ref 3.5–5.1)
Sodium: 144 mmol/L (ref 135–145)
Total Bilirubin: 0.8 mg/dL (ref 0.3–1.2)
Total Protein: 7.4 g/dL (ref 6.5–8.1)

## 2015-03-23 LAB — TROPONIN I
Troponin I: 0.07 ng/mL — ABNORMAL HIGH (ref <0.031)
Troponin I: 0.08 ng/mL — ABNORMAL HIGH (ref <0.031)

## 2015-03-23 LAB — MRSA PCR SCREENING: MRSA BY PCR: NEGATIVE

## 2015-03-23 MED ORDER — SODIUM CHLORIDE 0.9 % IV SOLN
INTRAVENOUS | Status: DC
  Administered 2015-03-23 – 2015-03-24 (×2): via INTRAVENOUS

## 2015-03-23 MED ORDER — SENNOSIDES-DOCUSATE SODIUM 8.6-50 MG PO TABS: 1.0000 | ORAL_TABLET | Freq: Every evening | ORAL | Status: DC | PRN

## 2015-03-23 MED ORDER — ALBUTEROL SULFATE (2.5 MG/3ML) 0.083% IN NEBU
10.0000 mg | INHALATION_SOLUTION | Freq: Once | RESPIRATORY_TRACT | Status: AC
  Administered 2015-03-23: 10 mg via RESPIRATORY_TRACT

## 2015-03-23 MED ORDER — HYDRALAZINE HCL 20 MG/ML IJ SOLN: 10.0000 mg | Freq: Four times a day (QID) | INTRAMUSCULAR | Status: DC | PRN

## 2015-03-23 MED ORDER — ONDANSETRON HCL 4 MG PO TABS: 4.0000 mg | ORAL_TABLET | Freq: Four times a day (QID) | ORAL | Status: DC | PRN

## 2015-03-23 MED ORDER — ALUM & MAG HYDROXIDE-SIMETH 200-200-20 MG/5ML PO SUSP: 30.0000 mL | Freq: Four times a day (QID) | ORAL | Status: DC | PRN

## 2015-03-23 MED ORDER — IPRATROPIUM-ALBUTEROL 0.5-2.5 (3) MG/3ML IN SOLN: 3.0000 mL | RESPIRATORY_TRACT | Status: DC

## 2015-03-23 MED ORDER — FUROSEMIDE 40 MG PO TABS
40.0000 mg | ORAL_TABLET | Freq: Every day | ORAL | Status: DC
  Administered 2015-03-24 – 2015-03-25 (×2): 40 mg via ORAL
  Filled 2015-03-23 (×2): qty 1

## 2015-03-23 MED ORDER — ENOXAPARIN SODIUM 40 MG/0.4ML ~~LOC~~ SOLN
40.0000 mg | SUBCUTANEOUS | Status: DC
  Administered 2015-03-23 – 2015-03-24 (×2): 40 mg via SUBCUTANEOUS
  Filled 2015-03-23 (×2): qty 0.4

## 2015-03-23 MED ORDER — ACETAMINOPHEN 325 MG PO TABS
650.0000 mg | ORAL_TABLET | Freq: Four times a day (QID) | ORAL | Status: DC | PRN
  Administered 2015-03-24 (×2): 650 mg via ORAL
  Filled 2015-03-23 (×2): qty 2

## 2015-03-23 MED ORDER — ALPRAZOLAM 0.25 MG PO TABS
0.2500 mg | ORAL_TABLET | Freq: Two times a day (BID) | ORAL | Status: DC | PRN
Start: 2015-03-23 — End: 2015-03-25

## 2015-03-23 MED ORDER — ONDANSETRON HCL 4 MG/2ML IJ SOLN: 4.0000 mg | Freq: Four times a day (QID) | INTRAMUSCULAR | Status: DC | PRN

## 2015-03-23 MED ORDER — CETYLPYRIDINIUM CHLORIDE 0.05 % MT LIQD
7.0000 mL | Freq: Two times a day (BID) | OROMUCOSAL | Status: DC
  Administered 2015-03-23 – 2015-03-25 (×3): 7 mL via OROMUCOSAL

## 2015-03-23 MED ORDER — TIOTROPIUM BROMIDE MONOHYDRATE 18 MCG IN CAPS
18.0000 ug | ORAL_CAPSULE | Freq: Every day | RESPIRATORY_TRACT | Status: DC
  Administered 2015-03-24 – 2015-03-25 (×2): 18 ug via RESPIRATORY_TRACT
  Filled 2015-03-23: qty 5

## 2015-03-23 MED ORDER — HYDROCODONE-ACETAMINOPHEN 5-325 MG PO TABS: 1.0000 | ORAL_TABLET | ORAL | Status: DC | PRN

## 2015-03-23 MED ORDER — ISOSORBIDE MONONITRATE ER 30 MG PO TB24
30.0000 mg | ORAL_TABLET | Freq: Every day | ORAL | Status: DC
  Administered 2015-03-24 – 2015-03-25 (×2): 30 mg via ORAL
  Filled 2015-03-23 (×2): qty 1

## 2015-03-23 MED ORDER — INFLUENZA VAC SPLIT QUAD 0.5 ML IM SUSY
0.5000 mL | PREFILLED_SYRINGE | INTRAMUSCULAR | Status: AC
  Administered 2015-03-24: 0.5 mL via INTRAMUSCULAR
  Filled 2015-03-23: qty 0.5

## 2015-03-23 MED ORDER — IPRATROPIUM-ALBUTEROL 0.5-2.5 (3) MG/3ML IN SOLN
RESPIRATORY_TRACT | Status: AC
  Administered 2015-03-23: 3 mL via RESPIRATORY_TRACT
  Filled 2015-03-23: qty 3

## 2015-03-23 MED ORDER — METHYLPREDNISOLONE SODIUM SUCC 125 MG IJ SOLR
125.0000 mg | Freq: Once | INTRAMUSCULAR | Status: AC
  Administered 2015-03-23: 125 mg via INTRAVENOUS
  Filled 2015-03-23: qty 2

## 2015-03-23 MED ORDER — ATORVASTATIN CALCIUM 20 MG PO TABS
40.0000 mg | ORAL_TABLET | Freq: Every day | ORAL | Status: DC
  Administered 2015-03-23 – 2015-03-24 (×2): 40 mg via ORAL
  Filled 2015-03-23 (×2): qty 2

## 2015-03-23 MED ORDER — IPRATROPIUM-ALBUTEROL 0.5-2.5 (3) MG/3ML IN SOLN
3.0000 mL | Freq: Four times a day (QID) | RESPIRATORY_TRACT | Status: DC
  Administered 2015-03-24 – 2015-03-25 (×6): 3 mL via RESPIRATORY_TRACT
  Filled 2015-03-23 (×6): qty 3

## 2015-03-23 MED ORDER — ACETAMINOPHEN 650 MG RE SUPP: 650.0000 mg | Freq: Four times a day (QID) | RECTAL | Status: DC | PRN

## 2015-03-23 MED ORDER — ALBUTEROL SULFATE (2.5 MG/3ML) 0.083% IN NEBU
INHALATION_SOLUTION | RESPIRATORY_TRACT | Status: AC
  Administered 2015-03-23: 10 mg via RESPIRATORY_TRACT
  Filled 2015-03-23: qty 12

## 2015-03-23 MED ORDER — LEVOFLOXACIN IN D5W 500 MG/100ML IV SOLN
500.0000 mg | INTRAVENOUS | Status: DC
  Administered 2015-03-23 – 2015-03-24 (×2): 500 mg via INTRAVENOUS
  Filled 2015-03-23 (×3): qty 100

## 2015-03-23 MED ORDER — BRIMONIDINE TARTRATE 0.15 % OP SOLN
1.0000 [drp] | Freq: Every day | OPHTHALMIC | Status: DC
  Administered 2015-03-24 – 2015-03-25 (×2): 1 [drp] via OPHTHALMIC
  Filled 2015-03-23: qty 5

## 2015-03-23 MED ORDER — ASPIRIN EC 81 MG PO TBEC
81.0000 mg | DELAYED_RELEASE_TABLET | Freq: Every day | ORAL | Status: DC
  Administered 2015-03-24 – 2015-03-25 (×2): 81 mg via ORAL
  Filled 2015-03-23 (×2): qty 1

## 2015-03-23 MED ORDER — METHYLPREDNISOLONE SODIUM SUCC 125 MG IJ SOLR
60.0000 mg | Freq: Three times a day (TID) | INTRAMUSCULAR | Status: DC
  Administered 2015-03-23 – 2015-03-25 (×5): 60 mg via INTRAVENOUS
  Filled 2015-03-23 (×6): qty 2

## 2015-03-23 MED ORDER — BUDESONIDE-FORMOTEROL FUMARATE 160-4.5 MCG/ACT IN AERO
2.0000 | INHALATION_SPRAY | Freq: Two times a day (BID) | RESPIRATORY_TRACT | Status: DC
  Administered 2015-03-23 – 2015-03-25 (×4): 2 via RESPIRATORY_TRACT
  Filled 2015-03-23: qty 6

## 2015-03-23 MED ORDER — QUINAPRIL HCL 10 MG PO TABS
40.0000 mg | ORAL_TABLET | Freq: Every day | ORAL | Status: DC
  Administered 2015-03-24 – 2015-03-25 (×2): 40 mg via ORAL
  Filled 2015-03-23 (×3): qty 4

## 2015-03-23 MED ORDER — VITAMIN D 1000 UNITS PO TABS
1000.0000 [IU] | ORAL_TABLET | Freq: Every day | ORAL | Status: DC
  Administered 2015-03-24 – 2015-03-25 (×2): 1000 [IU] via ORAL
  Filled 2015-03-23 (×2): qty 1

## 2015-03-23 MED ORDER — LATANOPROST 0.005 % OP SOLN
1.0000 [drp] | Freq: Every day | OPHTHALMIC | Status: DC
  Administered 2015-03-23 – 2015-03-24 (×2): 1 [drp] via OPHTHALMIC
  Filled 2015-03-23: qty 2.5

## 2015-03-23 MED ORDER — IPRATROPIUM-ALBUTEROL 0.5-2.5 (3) MG/3ML IN SOLN
3.0000 mL | Freq: Once | RESPIRATORY_TRACT | Status: AC
  Administered 2015-03-23: 3 mL via RESPIRATORY_TRACT

## 2015-03-23 NOTE — ED Notes (Signed)
Respiratory has d/c bipap. On continuous nebulizer. RN at bedside monitoring.

## 2015-03-23 NOTE — H&P (Addendum)
[facility] [place] NAME: Manuel Murphy    MR#:  [id number]  DATE OF BIRTH:  08-03-1945  DATE OF ADMISSION:  03/23/2015  PRIMARY CARE PHYSICIAN: [facility]  REQUESTING/REFERRING PHYSICIAN: Dr. Jimmye Norman  CHIEF COMPLAINT:  Shortness of breath HISTORY OF PRESENT ILLNESS:  Manuel Murphy  is a 69 y.o. male with a known history of chronic respiratory failure due to COPD on 2 L of oxygen who presents with above complaint. Patient says this morning he woke up and he was feeling short of breath and wheezing. He was in his usual state of health yesterday. He went to [facility] when he had a sudden attack of shortness of breath and lethargy. He presented to the ER with these complaints. In the emergency room and ABG was performed which showed a CO2 of 99. He is currently placed on BiPAP machine. He did receive IV steroids and nebulizer treatment. While in the emergency room he was noted to have several sinus positives and was asymptomatic. He was on BiPAP during this time. PAST MEDICAL HISTORY:   Past Medical History  Diagnosis Date  . COPD (chronic obstructive pulmonary disease)   . Hypertension   .    [place] CHF (congestive heart failure)    [place] respiratory failure on 2 L of oxygen  PAST SURGICAL HISTORY:  Left knee replacement  SOCIAL HISTORY:   Social History  Substance Use Topics  . Smoking status: Never Smoker   . Smokeless tobacco: Not on file  . Alcohol Use: No    FAMILY HISTORY:  Positive CAD and COPD  DRUG ALLERGIES:   Allergies  Allergen Reactions  . 2,4-D Dimethylamine (Amisol)     Other reaction(s): Hallucination     REVIEW OF SYSTEMS:  CONSTITUTIONAL: No fever, fatigue or weakness.  EYES: No blurred or double vision.  EARS, NOSE, AND THROAT: No tinnitus or ear pain.  RESPIRATORY: Positive cough, shortness of breath and wheezing no hemoptysis.  CARDIOVASCULAR: No chest pain, orthopnea, edema.   GASTROINTESTINAL: No nausea, vomiting, diarrhea or abdominal pain.  GENITOURINARY: No dysuria, hematuria.  ENDOCRINE: No polyuria, nocturia,  HEMATOLOGY: No anemia, easy bruising or bleeding SKIN: No rash or lesion. MUSCULOSKELETAL: No joint pain or arthritis.   NEUROLOGIC: No tingling, numbness, weakness.  PSYCHIATRY: No anxiety or depression.   MEDICATIONS AT HOME:   Prior to Admission medications   Medication Sig Start Date End Date Taking? Authorizing Provider  albuterol (PROAIR HFA) 108 (90 BASE) MCG/ACT inhaler Inhale 1-2 puffs into the lungs every 6 (six) hours as needed. For wheezing 10/17/14  Yes Historical Provider, MD  ALPHAGAN P 0.15 % ophthalmic solution Plaglace 1 drop into both eyes daily. 12/25/14  Yes Historical Provider, MD  aspirin EC 81 MG tablet Take 81 mg by mouth daily.   Yes Historical Provider, MD  atorvastatin (LIPITOR) 40 MG tablet Take 40 mg by mouth at bedtime. 02/21/15  Yes Historical Provider, MD  Cholecalciferol (VITAMIN D-1000 MAX ST) 1000 UNITS tablet Take 1,000 Units by mouth daily.   Yes Historical Provider, MD  furosemide (LASIX) 40 MG tablet Take 40 mg by mouth daily.   Yes Historical Provider, MD  Ipratropium-Albuterol (COMBIVENT) 20-100 MCG/ACT AERS respimat Inhale 1 puff into the lungs 4 (four) times daily as needed. For wheezing and shortness of breath.   Yes Historical Provider, MD  isosorbide mononitrate (IMDUR) 30 MG 24 hr tablet Take 30 mg by mouth daily. 12/25/14  Yes Historical Provider, MD  latanoprost (XALATAN) 0.005 % ophthalmic solution Place 1 drop into both eyes at bedtime. 12/29/14  Yes Historical Provider, MD  metoprolol (LOPRESSOR) 50 MG tablet Take 50 mg by mouth 2 (two) times daily. 02/21/15  Yes Historical Provider, MD  quinapril (ACCUPRIL) 40 MG tablet Take 40 mg by mouth daily.   Yes Historical Provider, MD  ranitidine (ZANTAC) 300 MG tablet Take 300 mg by mouth daily.   Yes Historical Provider, MD  SPIRIVA HANDIHALER 18 MCG inhalation  capsule Place 18 mcg into inhaler and inhale daily. 12/24/14  Yes Historical Provider, MD  SYMBICORT 160-4.5 MCG/ACT inhaler Inhale 2 puffs into the lungs 2 (two) times daily. 02/21/15  Yes Historical Provider, MD      VITAL SIGNS:  Blood pressure 148/90, pulse 57, temperature 98.3 F (36.8 C), temperature source Oral, resp. rate 34, height 5\' 6"  (1.676 m), weight 95.709 kg (211 lb), SpO2 94 %.  PHYSICAL EXAMINATION:  GENERAL:  69 y.o.-year-old patient lying in the bed with no acute distress wearing BiPAP.  EYES: Pupils equal, round, reactive to light and accommodation. No scleral icterus. Extraocular muscles intact.  HEENT: Head atraumatic, normocephalic. Oropharynx not inspected patient wearing BiPAP  NECK:  Supple, no jugular venous distention. No thyroid enlargement, no tenderness.  LUNGS: Normal breath sounds bilaterally, no audible wheezing, rales,rhonchi or crepitation. No use of accessory muscles of respiration. It should be noted patient had wheezing on admission to the ER. CARDIOVASCULAR: S1, S2 normal. No murmurs, rubs, or gallops.  ABDOMEN: Soft, nontender, nondistended. Bowel sounds present. No organomegaly or mass.  EXTREMITIES: No pedal edema, cyanosis, or clubbing.  NEUROLOGIC: Cranial nerves II through XII are grossly intact. No focal deficits. PSYCHIATRIC: The patient is alert and oriented x 3.  SKIN: No obvious rash, lesion, or ulcer.   LABORATORY PANEL:   CBC  Recent Labs Lab 03/23/15 1616  WBC 6.9  HGB 12.0*  HCT 38.1*  PLT 188   ------------------------------------------------------------------------------------------------------------------  Chemistries  No results for input(s): NA, K, CL, CO2, GLUCOSE, BUN, CREATININE, CALCIUM, MG, AST, ALT, ALKPHOS, BILITOT in the last 168 hours.  Invalid input(s): GFRCGP ------------------------------------------------------------------------------------------------------------------  Cardiac Enzymes  Recent Labs Lab  03/23/15 1526  TROPONINI 0.07*   ------------------------------------------------------------------------------------------------------------------  RADIOLOGY:  Dg Chest 1 View  03/23/2015   CLINICAL DATA:  Shortness of breath today. History blood pressure was high. Normally on 2 L oxygen nasal cannula. Oxygen sat is 89%. History of COPD, hypertension, diabetes, CHF.  EXAM: CHEST  1 VIEW  COMPARISON:  08/26/2014  FINDINGS: Heart is enlarged. Prominent pulmonary arteries segments appear stable. There are no focal consolidations. No pleural effusions. No overt edema.  IMPRESSION: Cardiomegaly. No evidence for acute  abnormality.   Electronically Signed   By: Nolon Nations M.D.   On: 03/23/2015 15:56    EKG:  Sinus bradycardia  IMPRESSION AND PLAN:  69 year old male with a history of chronic respiratory failure on 2 L of oxygen for COPD and essential hypertension who presents with COPD exacerbation.  1. Acute on chronic hypoxic respiratory failure secondary to acute COPD exacerbation: Patient is currently on BiPAP. I will continue BiPAP. ABG will be ordered for later this evening. I have started IV steroids and DuoNeb's. We will continue with his outpatient inhaler. I've also started Levaquin for COPD exacerbation. Chest x-ray shows no evidence of pneumonia.  2. Sinus bradycardia: Patient had 2-3 seconds positive on telemetry while in the emergency room. I will stop metoprolol for now. I will have cardiology see the patient  in consultation. Patient does see Dr.Callwood him we will make a referral to him.  3. Essential hypertension: Continue outpatient medications with exception of metoprolol due to problem #2. Hydralazine when necessary has been ordered.   4. Elevated troponin: I suspect this is related to acute on chronic respiratory failure. I will continue to trend troponin. Patient denies any chest pain.   All the records are reviewed and case discussed with ED provider. Management  plans discussed with the patient and he is in agreement.  CODE STATUS: FULL  CRITICAL CARE TOTAL TIME TAKING CARE OF THIS PATIENT: 50 minutes.  Patient with increaed risk of pulmonary arrest needs step down and close monitoring   Nayla Dias M.D on 03/23/2015 at 5:31 PM  Between 7am to 6pm - Pager - [phone] After 6pm go to [email or web address] - password [email or web address] [facility]  [facility]  Office  [phone]  CC: Primary care physician; No primary care provider on file.

## 2015-03-23 NOTE — ED Notes (Signed)
Pt reports shortness of breath today, reports yesterday BP was high. Pt normally on 2L via [place]. Pt's oxygen sat 89% on 2L.

## 2015-03-23 NOTE — ED Notes (Addendum)
Pt placed on 4L via [place].

## 2015-03-23 NOTE — ED Notes (Signed)
Assumed pt care at this time. NAD noted. RR even and nonlabored. Family at bedside. Will continue to monitor. Pt and pt's family updated on plan of care.

## 2015-03-23 NOTE — ED Notes (Signed)
Patient increasingly lethargic. Severe bradycardia noted when patient begins to fall asleep. SpO2 occasionally will drop to mid 80's. MD notified. Respiratory in route.

## 2015-03-23 NOTE — ED Notes (Signed)
Pt noted to have bradycardic episode when falls asleep, lowest rate noted 27, raises to 50's when awakened, blood pressure maintains wnl

## 2015-03-23 NOTE — ED Provider Notes (Addendum)
[facility] Emergency Department Provider Note     Time seen: ----------------------------------------- 3:31 PM on 03/23/2015 -----------------------------------------    I have reviewed the triage vital signs and the nursing notes.   HISTORY  Chief Complaint Shortness of Breath    HPI Manuel Murphy is a 69 y.o. male who presents ER for shortness of breath today. Patient reports his blood pressure was high, is normally on 2 L of oxygen via nasal cannula. On 2 L his oxygen saturations were only 89%. States she's had increased cough with sputum production.   Past Medical History  Diagnosis Date  . COPD (chronic obstructive pulmonary disease)   . Hypertension   . Diabetes mellitus without complication   . CHF (congestive heart failure)     There are no active problems to display for this patient.   No past surgical history on file.  Allergies Review of patient's allergies indicates no known allergies.  Social History Social History  Substance Use Topics  . Smoking status: Never Smoker   . Smokeless tobacco: None  . Alcohol Use: No    Review of Systems Constitutional: Negative for fever. Eyes: Negative for visual changes. ENT: Negative for sore throat. Cardiovascular: Negative for chest pain. Respiratory: Positive shortness of breath and cough Gastrointestinal: Negative for abdominal pain, vomiting and diarrhea. Genitourinary: Negative for dysuria. Musculoskeletal: Negative for back pain. Skin: Negative for rash. Neurological: Negative for headaches, focal weakness or numbness.  10-point ROS otherwise negative.  ____________________________________________   PHYSICAL EXAM:  VITAL SIGNS: ED Triage Vitals  Enc Vitals Group     BP 03/23/15 1517 162/82 mmHg     Pulse Rate 03/23/15 1517 58     Resp 03/23/15 1517 24     Temp 03/23/15 1517 98.3 F (36.8 C)     Temp Source 03/23/15 1517 Oral     SpO2 03/23/15 1517 98 %     Weight  03/23/15 1517 211 lb (95.709 kg)     Height 03/23/15 1517 5\' 6"  (1.676 m)     Head Cir --      Peak Flow --      Pain Score 03/23/15 1517 0     Pain Loc --      Pain Edu? --      Excl. in [place]? --     Constitutional: Alert and oriented. Well appearing and in no distress. Eyes: Conjunctivae are normal. PERRL. Normal extraocular movements. ENT   Head: Normocephalic and atraumatic.   Nose: No congestion/rhinnorhea.   Mouth/Throat: Mucous membranes are moist.   Neck: No stridor. Cardiovascular: Normal rate, regular rhythm. Normal and symmetric distal pulses are present in all extremities. No murmurs, rubs, or gallops. Respiratory: Scattered rhonchi worse on the left Gastrointestinal: Soft and nontender. No distention. No abdominal bruits.  Musculoskeletal: Nontender with normal range of motion in all extremities. No joint effusions.  No lower extremity tenderness nor edema. Neurologic:  Normal speech and language. No gross focal neurologic deficits are appreciated. Speech is normal. No gait instability. Skin:  Skin is warm, dry and intact. No rash noted. Psychiatric: Mood and affect are normal. Speech and behavior are normal. Patient exhibits appropriate insight and judgment. ____________________________________________  EKG: Interpreted by me. Sinus bradycardia with rate of 55 bpm, normal PR interval, normal QRS with, normal QT interval. There is nonspecific T-wave inversion.  ____________________________________________  ED COURSE:  Pertinent labs & imaging results that were available during my care of the patient were reviewed by me and considered in  my medical decision making (see chart for details). Patient with dyspnea, unclear etiology. We'll check basic labs were evaluated. ____________________________________________    LABS (pertinent positives/negatives)  Labs Reviewed  CBC WITH DIFFERENTIAL/PLATELET - Abnormal; Notable for the following:    Hemoglobin 12.1  (*)    HCT 39.1 (*)    MCHC 31.0 (*)    RDW 15.2 (*)    All other components within normal limits  BLOOD GAS, VENOUS - Abnormal; Notable for the following:    pH, Ven 7.31 (*)    pCO2, Ven 99 (*)    Bicarbonate 49.8 (*)    Acid-Base Excess 18.3 (*)    All other components within normal limits  TROPONIN I - Abnormal; Notable for the following:    Troponin I 0.07 (*)    All other components within normal limits  CBC WITH DIFFERENTIAL/PLATELET - Abnormal; Notable for the following:    Hemoglobin 12.0 (*)    HCT 38.1 (*)    MCHC 31.4 (*)    RDW 15.3 (*)    All other components within normal limits  COMPREHENSIVE METABOLIC PANEL   CRITICAL CARE Performed by: Earleen Newport   Total critical care time: 30 minutes  Critical care time was exclusive of separately billable procedures and treating other patients.  Critical care was necessary to treat or prevent imminent or life-threatening deterioration.  Critical care was time spent personally by me on the following activities: development of treatment plan with patient and/or surrogate as well as nursing, discussions with consultants, evaluation of patient's response to treatment, examination of patient, obtaining history from patient or surrogate, ordering and performing treatments and interventions, ordering and review of laboratory studies, ordering and review of radiographic studies, pulse oximetry and re-evaluation of patient's condition.   RADIOLOGY Images were viewed by me  Chest x-ray IMPRESSION: Cardiomegaly. No evidence for acute abnormality. ____________________________________________  FINAL ASSESSMENT AND PLAN  Dyspnea, hypercarbia, acute respiratory failure  Plan: Patient with labs and imaging as dictated above. Patient has noted increased shortness of breath and some weakness. Here he appears to have remarkably increased CO2 levels. He is being placed on BiPAP, he'll receive IV steroids. Patient had  received a DuoNeb prior to these. Will need to be observed in the hospital overnight.   Earleen Newport, MD     Earleen Newport, MD 03/23/15 [phone]

## 2015-03-23 NOTE — Progress Notes (Signed)
eLink Physician-Brief Progress Note Patient Name: Manuel Murphy DOB: [birth year] MRN: [id number]   Date of Service  03/23/2015  HPI/Events of Note  69 yo male with PMH of COPD on 2.0 L/min home O2, HTN and CHF. Admitted with AECOPD with acute on chronic hypoxic and hypercarbic respiratory failure. ABG in ED = 7.31/99/49. From the ABG, I suspect that he chronically runs a pCO2 in the mid to high 80's. Current medical regimen includes Symbicort, Spivira, Duonebs, Solumedrol,  Levaquin, ASA, Lovenox, Lipitor, Imdur, Accupril and Vitamin D. Currently on 3.0 L.min [place] O2 with sat = 99% and RR = 21.   eICU Interventions  Continue present management. Wean FiO2 as tolerated. Keep O2 sats 88% to 90%.     Intervention Category Evaluation Type: New Patient Evaluation  [name] 03/23/2015, 10:05 PM

## 2015-03-24 DIAGNOSIS — J9621 Acute and chronic respiratory failure with hypoxia: Secondary | ICD-10-CM | POA: Diagnosis not present

## 2015-03-24 LAB — TROPONIN I
TROPONIN I: 0.07 ng/mL — AB (ref <0.031)
Troponin I: 0.07 ng/mL — ABNORMAL HIGH (ref <0.031)

## 2015-03-24 MED ORDER — AMLODIPINE BESYLATE 5 MG PO TABS
5.0000 mg | ORAL_TABLET | Freq: Every day | ORAL | Status: DC
  Administered 2015-03-24 – 2015-03-25 (×2): 5 mg via ORAL
  Filled 2015-03-24 (×2): qty 1

## 2015-03-24 NOTE — Progress Notes (Signed)
ambulated pt around ccu nurses station per order. Pt monitor by continuous pulse ox during ambulation. 02 saturation remained above 95%. No assistive devices used. Pt tolerated well. Vss. Pt denies pain.  Assisted pt to chair after ambulation.

## 2015-03-24 NOTE — Progress Notes (Signed)
[place] [facility] [place] NAME: Manuel Murphy    MR#:  [id number]  DATE OF BIRTH:  1946-07-11  SUBJECTIVE:  CHIEF COMPLAINT:   Chief Complaint  Patient presents with  . Shortness of Breath   feeling much better, off BiPAP, Daughter at bedside REVIEW OF SYSTEMS:  Review of Systems  Constitutional: Negative for fever, weight loss, malaise/fatigue and diaphoresis.  HENT: Negative for ear discharge, ear pain, hearing loss, nosebleeds, sore throat and tinnitus.   Eyes: Negative for blurred vision and pain.  Respiratory: Positive for shortness of breath. Negative for cough, hemoptysis and wheezing.   Cardiovascular: Negative for chest pain, palpitations, orthopnea and leg swelling.  Gastrointestinal: Negative for heartburn, nausea, vomiting, abdominal pain, diarrhea, constipation and blood in stool.  Genitourinary: Negative for dysuria, urgency and frequency.  Musculoskeletal: Negative for myalgias and back pain.  Skin: Negative for itching and rash.  Neurological: Negative for dizziness, tingling, tremors, focal weakness, seizures, weakness and headaches.  Psychiatric/Behavioral: Negative for depression. The patient is not nervous/anxious.    DRUG ALLERGIES:   Allergies  Allergen Reactions  . 2,4-D Dimethylamine (Amisol)     Other reaction(s): Hallucination   VITALS:  Blood pressure 160/92, pulse 81, temperature 98.1 F (36.7 C), temperature source Oral, resp. rate 40, height 5\' 6"  (1.676 m), weight 94.6 kg (208 lb 8.9 oz), SpO2 100 %. PHYSICAL EXAMINATION:  Physical Exam  Constitutional: He is oriented to person, place, and time and well-developed, well-nourished, and in no distress.  HENT:  Head: Normocephalic and atraumatic.  Eyes: Conjunctivae and EOM are normal. Pupils are equal, round, and reactive to light.  Neck: Normal range of motion. Neck supple. No tracheal deviation present. No thyromegaly present.  Cardiovascular: Normal  rate, regular rhythm and normal heart sounds.   Pulmonary/Chest: Effort normal and breath sounds normal. No respiratory distress. He has no wheezes. He exhibits no tenderness.  Abdominal: Soft. Bowel sounds are normal. He exhibits no distension. There is no tenderness.  Musculoskeletal: Normal range of motion.  Neurological: He is alert and oriented to person, place, and time. No cranial nerve deficit.  Skin: Skin is warm and dry. No rash noted.  Psychiatric: Mood and affect normal.   LABORATORY PANEL:   CBC  Recent Labs Lab 03/23/15 1616  WBC 6.9  HGB 12.0*  HCT 38.1*  PLT 188   ------------------------------------------------------------------------------------------------------------------ Chemistries   Recent Labs Lab 03/23/15 1735  NA 144  K 4.2  CL 94*  CO2 41*  GLUCOSE 88  BUN 13  CREATININE 0.93  CALCIUM 8.7*  AST 18  ALT 17  ALKPHOS 63  BILITOT 0.8   RADIOLOGY:  Dg Chest 1 View  03/23/2015   CLINICAL DATA:  Shortness of breath today. History blood pressure was high. Normally on 2 L oxygen nasal cannula. Oxygen sat is 89%. History of COPD, hypertension, diabetes, CHF.  EXAM: CHEST  1 VIEW  COMPARISON:  08/26/2014  FINDINGS: Heart is enlarged. Prominent pulmonary arteries segments appear stable. There are no focal consolidations. No pleural effusions. No overt edema.  IMPRESSION: Cardiomegaly. No evidence for acute  abnormality.   Electronically Signed   By: Nolon Nations M.D.   On: 03/23/2015 15:56   ASSESSMENT AND PLAN:  69 year old male with a history of chronic respiratory failure on 2 L of oxygen for COPD and essential hypertension who presents with COPD exacerbation.  1. Acute on chronic hypoxic respiratory failure secondary to acute COPD exacerbation: now off BiPAP. continue  IV steroids and DuoNeb's and inhaler.empiric Levaquin for COPD exacerbation. Chest x-ray shows no evidence of pneumonia.  2. Sinus bradycardia: Appreciate cardiology input.   Metoprolol has been stopped.  Heart rate has been stable  3. Essential hypertension: Continue outpatient medications with exception of metoprolol due to problem #2. Hydralazine when necessary has been ordered.  4. Elevated troponin: I suspect this is related to acute on chronic respiratory failure. Patient denies any chest pain. No Further workup per cardiology     All the records are reviewed and case discussed with Care Management/Social Worker. Management plans discussed with the patient, family and they are in agreement.  CODE STATUS: Full code  TOTAL TIME TAKING CARE OF THIS PATIENT: 35 minutes.   More than 50% of the time was spent in counseling/coordination of care: YES  POSSIBLE D/C IN 1-2 DAYS, DEPENDING ON CLINICAL CONDITION.  Can be transferred to medical floor today with off unit telemetry   [facility], Joshuwa Vecchio M.D on 03/24/2015 at 11:24 AM  Between 7am to 6pm - Pager - [phone]  After 6pm go to [email or web address] - password [email or web address] [facility]  [place] [facility]  Office  [phone]  CC:  Primary care physician; No primary care provider on file.

## 2015-03-24 NOTE — Consult Note (Signed)
[facility]  CARDIOLOGY CONSULT NOTE  Patient ID: Manuel Murphy MRN: [id number] DOB/AGE: [birth year] 69 y.o.  Admit date: 03/23/2015 Referring Physician Bettey Costa MD Primary Physician [facility] Primary Cardiologist Lujean Amel M.D. Reason for Consultation sinus pauses  HPI: The patient is a 69 year old gentleman with known COPD, sleep apnea, essential hypertension, diastolic congestive heart failure who was admitted with some breath, wheezing, nonproductive cough chest increased over the past one to 2 weeks. Patient presented to [facility] emergency room he was noted to be hypoxic, treated with BiPAP machine, intravenous steroids and nebulizer therapy. In the emergency room the patient was noted to have intermittent sinus pauses. She was admitted to the ICU where transient sinus bradycardia was noted during sleep. Metoprolol has been held. The pressure is mildly elevated today. Rate is normal at 84 bpm. The patient denies chest pain, presyncope or syncope. Troponin is borderline elevated to 0.08 without peak or trough. EKG is nondiagnostic.  Review of systems complete and found to be negative unless listed above     Past Medical History  Diagnosis Date  . COPD (chronic obstructive pulmonary disease)   . Hypertension   . CHF (congestive heart failure)   . GERD (gastroesophageal reflux disease)   . Chronic respiratory failure   . Glaucoma     Past Surgical History  Procedure Laterality Date  . Total knee arthroplasty      Prescriptions prior to admission  Medication Sig Dispense Refill Last Dose  . albuterol (PROAIR HFA) 108 (90 BASE) MCG/ACT inhaler Inhale 1-2 puffs into the lungs every 6 (six) hours as needed. For wheezing   Past Month at Unknown time  . ALPHAGAN P 0.15 % ophthalmic solution Place 1 drop into both eyes daily.   03/23/2015 at Unknown time  . aspirin EC 81 MG tablet Take 81 mg by mouth daily.   03/23/2015 at Unknown time  . atorvastatin (LIPITOR) 40 MG tablet  Take 40 mg by mouth at bedtime.   03/22/2015 at Unknown time  . Cholecalciferol (VITAMIN D-1000 MAX ST) 1000 UNITS tablet Take 1,000 Units by mouth daily.   Past Month at Unknown time  . furosemide (LASIX) 40 MG tablet Take 40 mg by mouth daily.   Past Month at Unknown time  . Ipratropium-Albuterol (COMBIVENT) 20-100 MCG/ACT AERS respimat Inhale 1 puff into the lungs 4 (four) times daily as needed. For wheezing and shortness of breath.   Past Month at Unknown time  . isosorbide mononitrate (IMDUR) 30 MG 24 hr tablet Take 30 mg by mouth daily.   03/23/2015 at Unknown time  . latanoprost (XALATAN) 0.005 % ophthalmic solution Place 1 drop into both eyes at bedtime.   03/22/2015 at Unknown time  . metoprolol (LOPRESSOR) 50 MG tablet Take 50 mg by mouth 2 (two) times daily.   03/23/2015 at 0900  . quinapril (ACCUPRIL) 40 MG tablet Take 40 mg by mouth daily.   03/23/2015 at Unknown time  . ranitidine (ZANTAC) 300 MG tablet Take 300 mg by mouth daily.   Past Month at Unknown time  . SPIRIVA HANDIHALER 18 MCG inhalation capsule Place 18 mcg into inhaler and inhale daily.   03/23/2015 at Unknown time  . SYMBICORT 160-4.5 MCG/ACT inhaler Inhale 2 puffs into the lungs 2 (two) times daily.   03/23/2015 at Unknown time   Social History   Social History  . Marital Status: Married    Spouse Name: N/A  . Number of Children: N/A  . Years of Education: N/A  Occupational History  . Not on file.   Social History Main Topics  . Smoking status: Never Smoker   . Smokeless tobacco: Not on file  . Alcohol Use: No  . Drug Use: Not on file  . Sexual Activity: Not on file   Other Topics Concern  . Not on file   Social History Narrative  . No narrative on file    No family history on file.    Review of systems complete and found to be negative unless listed above      PHYSICAL EXAM  General: Well developed, well nourished, in no acute distress HEENT:  Normocephalic and atramatic Neck:  No JVD.  Lungs:  Decreased breath sounds bilaterally Heart: HRRR . Normal S1 and S2 without gallops or murmurs.  Abdomen: Bowel sounds are positive, abdomen soft and non-tender  Msk:  Back normal, normal gait. Normal strength and tone for age. Extremities: No clubbing, cyanosis or edema.   Neuro: Alert and oriented X 3. Psych:  Good affect, responds appropriately  Labs:   Lab Results  Component Value Date   WBC 6.9 03/23/2015   HGB 12.0* 03/23/2015   HCT 38.1* 03/23/2015   MCV 86.1 03/23/2015   PLT 188 03/23/2015    Recent Labs Lab 03/23/15 1735  NA 144  K 4.2  CL 94*  CO2 41*  BUN 13  CREATININE 0.93  CALCIUM 8.7*  PROT 7.4  BILITOT 0.8  ALKPHOS 63  ALT 17  AST 18  GLUCOSE 88   Lab Results  Component Value Date   TROPONINI 0.07* 03/24/2015   No results found for: CHOL No results found for: HDL No results found for: LDLCALC No results found for: TRIG No results found for: CHOLHDL No results found for: LDLDIRECT    Radiology: Dg Chest 1 View  03/23/2015   CLINICAL DATA:  Shortness of breath today. History blood pressure was high. Normally on 2 L oxygen nasal cannula. Oxygen sat is 89%. History of COPD, hypertension, diabetes, CHF.  EXAM: CHEST  1 VIEW  COMPARISON:  08/26/2014  FINDINGS: Heart is enlarged. Prominent pulmonary arteries segments appear stable. There are no focal consolidations. No pleural effusions. No overt edema.  IMPRESSION: Cardiomegaly. No evidence for acute  abnormality.   Electronically Signed   By: Nolon Nations M.D.   On: 03/23/2015 15:56    EKG: Sinus bradycardia  ASSESSMENT AND PLAN:   1. Transient sinus pauses, and sinus bradycardia during sleep, of uncertain clinical significance, in patient with known sleep apnea, on beta blocker therapy. Heart rate normal today. 2. Borderline elevated troponin, in the absence of chest pain or ECG changes, likely demand supply ischemia due to hypoxia.  Recommendations  1. Hold beta blocker for now 2. Start  amlodipine 5 mg daily for hypertension 3. Defer full dose anticoagulation 4. Consider 2-D echocardiogram if not performed during the past 6 months 5. Defer cardiac catheterization for elevated troponin  Signed: Jenaya Saar MD,PhD, [facility] 03/24/2015, 9:45 AM

## 2015-03-25 ENCOUNTER — Inpatient Hospital Stay: Payer: Medicare Other

## 2015-03-25 LAB — BLOOD GAS, ARTERIAL
ACID-BASE EXCESS: 16.4 mmol/L — AB (ref 0.0–3.0)
BICARBONATE: 48.1 meq/L — AB (ref 21.0–28.0)
FIO2: 0.32
O2 SAT: 93.4 %
Patient temperature: 37
pCO2 arterial: 100 mmHg (ref 32.0–48.0)
pH, Arterial: 7.29 — ABNORMAL LOW (ref 7.350–7.450)
pO2, Arterial: 76 mmHg — ABNORMAL LOW (ref 83.0–108.0)

## 2015-03-25 LAB — BASIC METABOLIC PANEL
Anion gap: 7 (ref 5–15)
BUN: 14 mg/dL (ref 6–20)
CHLORIDE: 97 mmol/L — AB (ref 101–111)
CO2: 38 mmol/L — ABNORMAL HIGH (ref 22–32)
CREATININE: 0.73 mg/dL (ref 0.61–1.24)
Calcium: 8.6 mg/dL — ABNORMAL LOW (ref 8.9–10.3)
GFR calc Af Amer: >60 mL/min (ref >60)
GFR calc non Af Amer: >60 mL/min (ref >60)
Glucose, Bld: 158 mg/dL — ABNORMAL HIGH (ref 65–99)
Potassium: 4.4 mmol/L (ref 3.5–5.1)
SODIUM: 142 mmol/L (ref 135–145)

## 2015-03-25 LAB — BLOOD GAS, VENOUS
ACID-BASE EXCESS: 18.3 mmol/L — AB (ref 0.0–3.0)
Bicarbonate: 49.8 mEq/L — ABNORMAL HIGH (ref 21.0–28.0)
PATIENT TEMPERATURE: 37
PH VEN: 7.31 — AB (ref 7.320–7.430)
pCO2, Ven: 99 mmHg (ref 44.0–60.0)

## 2015-03-25 LAB — CBC
HCT: 38.1 % — ABNORMAL LOW (ref 40.0–52.0)
Hemoglobin: 12 g/dL — ABNORMAL LOW (ref 13.0–17.0)
MCH: 27.3 pg (ref 26.0–34.0)
MCHC: 31.4 g/dL — AB (ref 32.0–36.0)
MCV: 86.9 fL (ref 80.0–100.0)
PLATELETS: 202 10*3/uL (ref 150–440)
RBC: 4.39 MIL/uL — ABNORMAL LOW (ref 4.40–5.90)
RDW: 15.7 % — ABNORMAL HIGH (ref 11.5–14.5)
WBC: 9.7 10*3/uL (ref 3.8–10.6)

## 2015-03-25 MED ORDER — PREDNISONE 10 MG (21) PO TBPK: 10.0000 mg | ORAL_TABLET | Freq: Every day | ORAL | Status: DC

## 2015-03-25 MED ORDER — LEVOFLOXACIN 500 MG PO TABS: 500.0000 mg | ORAL_TABLET | Freq: Every day | ORAL | Status: DC

## 2015-03-25 MED ORDER — METOPROLOL TARTRATE 50 MG PO TABS: 50.0000 mg | ORAL_TABLET | Freq: Every morning | ORAL | Status: DC

## 2015-03-25 NOTE — Progress Notes (Signed)
Pt for discharge home alert/ no resp distress. 02 in use at 2l [place]. Pt on this at home. Instructions discussed with pt and dtr.  meds discussede with pt . Diet/ activity and f/u discussed. slx2 d/cd.  Denies pain. Home via w/c at this time with wife. Verbalize understanding of all  plans

## 2015-03-25 NOTE — Plan of Care (Signed)
Problem: Discharge Progression Outcomes Goal: Discharge plan in place and appropriate Pt with COPD Home O2 prn Goal: Other Discharge Outcomes/Goals Plan of care progress to goal: Pain: tylenol given for headache with improvement Hemodynamically:VSS Diet: good appetite Activity: minimal assistance

## 2015-03-25 NOTE — Care Management Important Message (Signed)
Important Message  Patient Details  Name: Manuel Murphy MRN: [id number] Date of Birth: 1946/01/05   Medicare Important Message Given:  Yes-third notification given    Shelbie Ammons, RN 03/25/2015, 8:20 AM

## 2015-03-25 NOTE — Discharge Instructions (Signed)
Chronic Obstructive Pulmonary Disease Chronic obstructive pulmonary disease (COPD) is a common lung problem. In COPD, the flow of air from the lungs is limited. The way your lungs work will probably never return to normal, but there are things you can do to improve your lungs and make yourself feel better. HOME CARE  Take all medicines as told by your doctor.  Avoid medicines or cough syrups that dry up your airway (such as antihistamines) and do not allow you to get rid of thick spit. You do not need to avoid them if told differently by your doctor.  If you smoke, stop. Smoking makes the problem worse.  Avoid being around things that make your breathing worse (like smoke, chemicals, and fumes).  Use oxygen therapy and therapy to help improve your lungs (pulmonary rehabilitation) if told by your doctor. If you need home oxygen therapy, ask your doctor if you should buy a tool to measure your oxygen level (oximeter).  Avoid people who have a sickness you can catch (contagious).  Avoid going outside when it is very hot, cold, or humid.  Eat healthy foods. Eat smaller meals more often. Rest before meals.  Stay active, but remember to also rest.  Make sure to get all the shots (vaccines) your doctor recommends. Ask your doctor if you need a pneumonia shot.  Learn and use tips on how to relax.  Learn and use tips on how to control your breathing as told by your doctor. Try:  Breathing in (inhaling) through your nose for 1 second. Then, pucker your lips and breath out (exhale) through your lips for 2 seconds.  Putting one hand on your belly (abdomen). Breathe in slowly through your nose for 1 second. Your hand on your belly should move out. Pucker your lips and breathe out slowly through your lips. Your hand on your belly should move in as you breathe out.  Learn and use controlled coughing to clear thick spit from your lungs. The steps are: 1. Lean your head a little forward. 2. Breathe  in deeply. 3. Try to hold your breath for 3 seconds. 4. Keep your mouth slightly open while coughing 2 times. 5. Spit any thick spit out into a tissue. 6. Rest and do the steps again 1 or 2 times as needed. GET HELP IF:  You cough up more thick spit than usual.  There is a change in the color or thickness of the spit.  It is harder to breathe than usual.  Your breathing is faster than usual. GET HELP RIGHT AWAY IF:   You have shortness of breath while resting.  You have shortness of breath that stops you from:  Being able to talk.  Doing normal activities.  You chest hurts for longer than 5 minutes.  Your skin color is more blue than usual.  Your pulse oximeter shows that you have low oxygen for longer than 5 minutes. MAKE SURE YOU:   Understand these instructions.  Will watch your condition.  Will get help right away if you are not doing well or get worse. Document Released: 12/23/2007 Document Revised: 11/20/2013 Document Reviewed: 03/02/2013 Exit[employer] Patient Information 2015 [employer]. This information is not intended to replace advice given to you by your health care provider. Make sure you discuss any questions you have with your health care provider.  

## 2015-03-25 NOTE — Care Management (Signed)
Admitted to [facility] with the diagnosis of COPD. Lives with wife, [place] [phone]).  Last seen at [facility] August 25th. Home Health x 1 in the past. "Insurance company sent them."  No skilled facility. Uses no aids for ambulation. Home oxygen x 3 years for the last year thru [facility]. Takes care of all activities of daily living, both basic and instrumental himself, still drives.  Discharge to home today per Dr. Manuella Ghazi. Wife will transport. Shelbie Ammons RN MSN Care Management [phone]

## 2015-03-26 NOTE — Discharge Summary (Signed)
Manuel Murphy NAME: Manuel Murphy    MR#:  [id number]  DATE OF BIRTH:  06-Mar-1946  DATE OF ADMISSION:  03/23/2015 ADMITTING PHYSICIAN: Bettey Costa, MD  DATE OF DISCHARGE: 03/25/2015  1:41 PM  PRIMARY CARE PHYSICIAN: [facility] [place]   ADMISSION DIAGNOSIS:  Sinus pause [I45.5] Chronic obstructive pulmonary disease with acute exacerbation [J44.1] Acute respiratory failure with hypoxia and hypercarbia [J96.01]  DISCHARGE DIAGNOSIS:  Active Problems:   COPD (chronic obstructive pulmonary disease)  acute on chronic hypoxic respiratory failure  SECONDARY DIAGNOSIS:   Past Medical History  Diagnosis Date  . COPD (chronic obstructive pulmonary disease)   . Hypertension   . CHF (congestive heart failure)   . GERD (gastroesophageal reflux disease)   . Chronic respiratory failure   . Glaucoma    HOSPITAL COURSE:  69 year old male with a history of chronic respiratory failure on 2 L of oxygen for COPD and essential hypertension who was admitted for Acute on chronic hypoxic respiratory failure secondary to acute COPD exacerbation.  Please see Dr. Gardiner Coins dictated history and physical for further details.  He initially required BiPAP and was subsequently weaned off to nasal cannula oxygen.  Chest x-ray showed no evidence of pneumonia.  He was noted have transient sinus bradycardia and pauses for which cardiology consultation was obtained with Dr Saralyn Pilar who recommended to stop Beta blocker which helped his heart rate.  Patient also had borderline elevated troponin, which was thought to be due to supply demand ischemia.  By the day of discharge, patient's heart rate was improving and was not bradycardic.  On discharge his home metoprolol dose was cut in half instead of completely stopped.  He was feeling much better fifth of September and responded very well to IV antibiotics, steroids and neb.  Treatment was close to  his baseline was discharged home in stable condition.  He is agreeable with the discharge plans. DISCHARGE CONDITIONS:  stable CONSULTS OBTAINED:  Treatment Team:  Isaias Cowman, MD DRUG ALLERGIES:   Allergies  Allergen Reactions  . 2,4-D Dimethylamine (Amisol)     Other reaction(s): Hallucination   DISCHARGE MEDICATIONS:   Discharge Medication List as of 03/25/2015 10:07 AM    START taking these medications   Details  levofloxacin (LEVAQUIN) 500 MG tablet Take 1 tablet (500 mg total) by mouth daily., Starting 03/25/2015, Until Discontinued, Normal    predniSONE (STERAPRED UNI-PAK 21 TAB) 10 MG (21) TBPK tablet Take 1 tablet (10 mg total) by mouth daily. Start at 60 mg once daily, taper 10 mg daily until finish, Starting 03/25/2015, Until Discontinued, Normal      CONTINUE these medications which have CHANGED   Details  metoprolol (LOPRESSOR) 50 MG tablet Take 1 tablet (50 mg total) by mouth every morning., Starting 03/25/2015, Until Discontinued, Normal      CONTINUE these medications which have NOT CHANGED   Details  albuterol (PROAIR HFA) 108 (90 BASE) MCG/ACT inhaler Inhale 1-2 puffs into the lungs every 6 (six) hours as needed. For wheezing, Starting 10/17/2014, Until Discontinued, Historical Med    ALPHAGAN P 0.15 % ophthalmic solution Place 1 drop into both eyes daily., Starting 12/25/2014, Until Discontinued, Historical Med    aspirin EC 81 MG tablet Take 81 mg by mouth daily., Until Discontinued, Historical Med    atorvastatin (LIPITOR) 40 MG tablet Take 40 mg by mouth at bedtime., Starting 02/21/2015, Until Discontinued, Historical Med    Cholecalciferol (VITAMIN D-1000 MAX  ST) 1000 UNITS tablet Take 1,000 Units by mouth daily., Until Discontinued, Historical Med    furosemide (LASIX) 40 MG tablet Take 40 mg by mouth daily., Until Discontinued, Historical Med    Ipratropium-Albuterol (COMBIVENT) 20-100 MCG/ACT AERS respimat Inhale 1 puff into the lungs 4 (four) times  daily as needed. For wheezing and shortness of breath., Until Discontinued, Historical Med    isosorbide mononitrate (IMDUR) 30 MG 24 hr tablet Take 30 mg by mouth daily., Starting 12/25/2014, Until Discontinued, Historical Med    latanoprost (XALATAN) 0.005 % ophthalmic solution Place 1 drop into both eyes at bedtime., Starting 12/29/2014, Until Discontinued, Historical Med    quinapril (ACCUPRIL) 40 MG tablet Take 40 mg by mouth daily., Until Discontinued, Historical Med    ranitidine (ZANTAC) 300 MG tablet Take 300 mg by mouth daily., Until Discontinued, Historical Med    SPIRIVA HANDIHALER 18 MCG inhalation capsule Place 18 mcg into inhaler and inhale daily., Starting 12/24/2014, Until Discontinued, Historical Med    SYMBICORT 160-4.5 MCG/ACT inhaler Inhale 2 puffs into the lungs 2 (two) times daily., Starting 02/21/2015, Until Discontinued, Historical Med       DISCHARGE INSTRUCTIONS:   DIET:  Cardiac diet  DISCHARGE CONDITION:  Good  ACTIVITY:  Activity as tolerated  OXYGEN:  Home Oxygen: No.   Oxygen Delivery: room air  DISCHARGE LOCATION:  home   If you experience worsening of your admission symptoms, develop shortness of breath, life threatening emergency, suicidal or homicidal thoughts you must seek medical attention immediately by calling 911 or calling your MD immediately  if symptoms less severe.  You Must read complete instructions/literature along with all the possible adverse reactions/side effects for all the Medicines you take and that have been prescribed to you. Take any new Medicines after you have completely understood and accpet all the possible adverse reactions/side effects.   Please note  You were cared for by a hospitalist during your hospital stay. If you have any questions about your discharge medications or the care you received while you were in the hospital after you are discharged, you can call the unit and asked to speak with the hospitalist on call  if the hospitalist that took care of you is not available. Once you are discharged, your primary care physician will handle any further medical issues. Please note that NO REFILLS for any discharge medications will be authorized once you are discharged, as it is imperative that you return to your primary care physician (or establish a relationship with a primary care physician if you do not have one) for your aftercare needs so that they can reassess your need for medications and monitor your lab values.    On the day of Discharge: VITAL SIGNS:  Blood pressure 190/105, pulse 90, temperature 98.4 F (36.9 C), temperature source Oral, resp. rate 18, height 5\' 6"  (1.676 m), weight 94.6 kg (208 lb 8.9 oz), SpO2 94 %. PHYSICAL EXAMINATION:  GENERAL:  69 y.o.-year-old patient lying in the bed with no acute distress.  EYES: Pupils equal, round, reactive to light and accommodation. No scleral icterus. Extraocular muscles intact.  HEENT: Head atraumatic, normocephalic. Oropharynx and nasopharynx clear.  NECK:  Supple, no jugular venous distention. No thyroid enlargement, no tenderness.  LUNGS: Normal breath sounds bilaterally, no wheezing, rales,rhonchi or crepitation. No use of accessory muscles of respiration.  CARDIOVASCULAR: S1, S2 normal. No murmurs, rubs, or gallops.  ABDOMEN: Soft, non-tender, non-distended. Bowel sounds present. No organomegaly or mass.  EXTREMITIES: No pedal edema,  cyanosis, or clubbing.  NEUROLOGIC: Cranial nerves II through XII are intact. Muscle strength 5/5 in all extremities. Sensation intact. Gait not checked.  PSYCHIATRIC: The patient is alert and oriented x 3.  SKIN: No obvious rash, lesion, or ulcer.  DATA REVIEW:   CBC  Recent Labs Lab 03/25/15 0524  WBC 9.7  HGB 12.0*  HCT 38.1*  PLT 202    Chemistries   Recent Labs Lab 03/23/15 1735 03/25/15 0524  NA 144 142  K 4.2 4.4  CL 94* 97*  CO2 41* 38*  GLUCOSE 88 158*  BUN 13 14  CREATININE 0.93 0.73   CALCIUM 8.7* 8.6*  AST 18  --   ALT 17  --   ALKPHOS 63  --   BILITOT 0.8  --     Cardiac Enzymes  Recent Labs Lab 03/24/15 0900  TROPONINI 0.07*   RADIOLOGY:  Dg Chest 2 View  03/25/2015   CLINICAL DATA:  69 year old male with increased shortness breath for 2 days. Initial encounter.  EXAM: CHEST  2 VIEW  COMPARISON:  03/23/2015 and earlier.  FINDINGS: Continued low lung volumes. Bibasilar patchy and confluent opacity greater on the right. No pneumothorax or pleural effusion. No acute pulmonary edema. Stable cardiac size and mediastinal contours. Visualized tracheal air column is within normal limits. No acute osseous abnormality identified.  IMPRESSION: Low lung volumes with right greater than left bibasilar opacity which most resembles atelectasis. Difficult to exclude right lung base pneumonia.   Electronically Signed   By: Genevie Ann M.D.   On: 03/25/2015 12:26    Management plans discussed with the patient, family and they are in agreement.  CODE STATUS: Full code  TOTAL TIME TAKING CARE OF THIS PATIENT: 55 minutes.    [facility], Deondre Marinaro M.D on 03/26/2015 at 3:02 PM  Between 7am to 6pm - Pager - [phone]  After 6pm go to [email or web address] - password [email or web address] [place] [facility]  Office  [phone]  CC: Primary care physician; [facility] MD,PhD, Louanne Skye MD

## 2015-03-27 LAB — GLUCOSE, CAPILLARY: Glucose-Capillary: 94 mg/dL (ref 65–99)

## 2016-01-07 IMAGING — CT CT HEAD WITHOUT CONTRAST
1 series · 16 of 30 positions shown, 20 images · non-contrast
Comparison: CT neck 04/05/2013

CLINICAL DATA: Elevated blood pressure. Increasing shortness of
breath. Slurred speech. Dizzy for 2 days.

EXAM:
CT HEAD WITHOUT CONTRAST
TECHNIQUE: Contiguous axial images were obtained from the base of the skull
through the vertex without intravenous contrast.

[Series 2: head wo · axial · 0.46mm/px · z∈[-102,+33]mm · 16 of 30 slices shown, 20 images]
[im 2/30  brain]
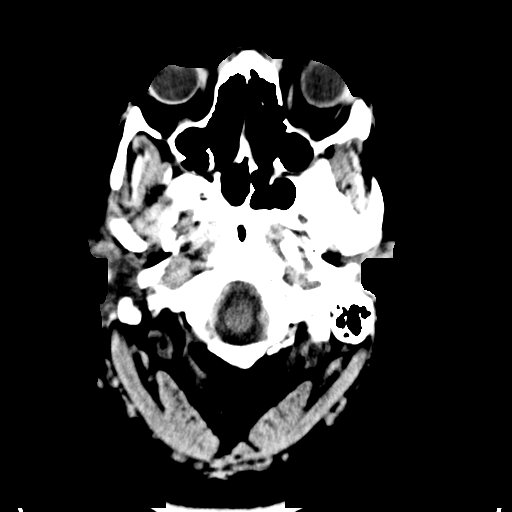
[im 2/30  bone]
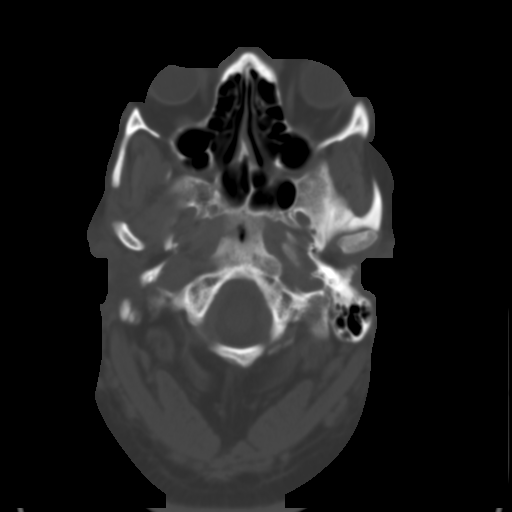
[im 4/30  brain]
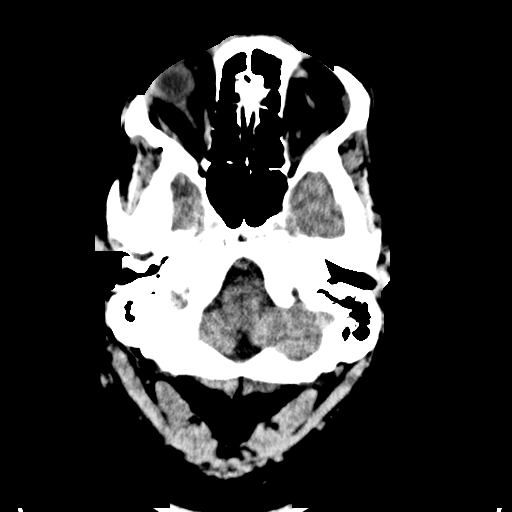
[im 6/30  brain]
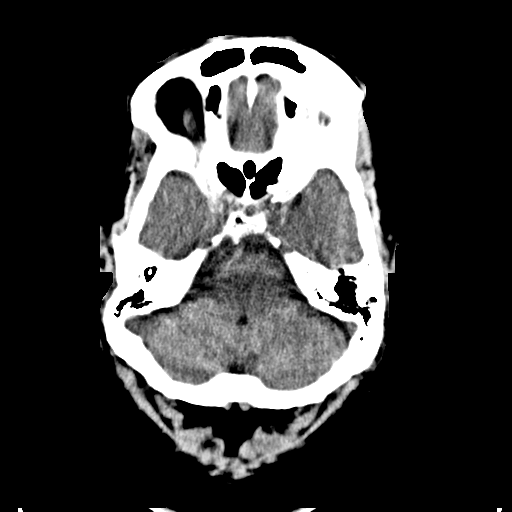
[im 8/30  brain]
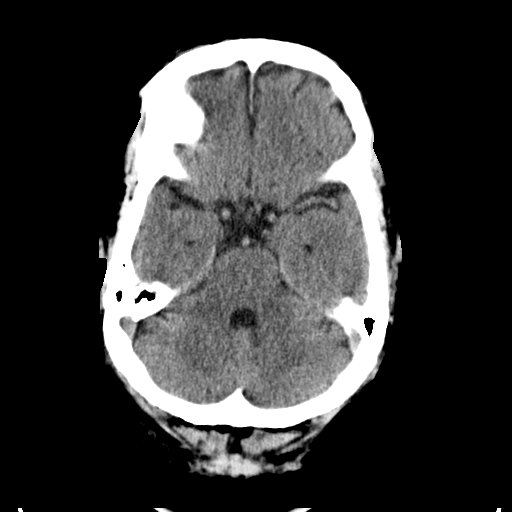
[im 9/30  brain]
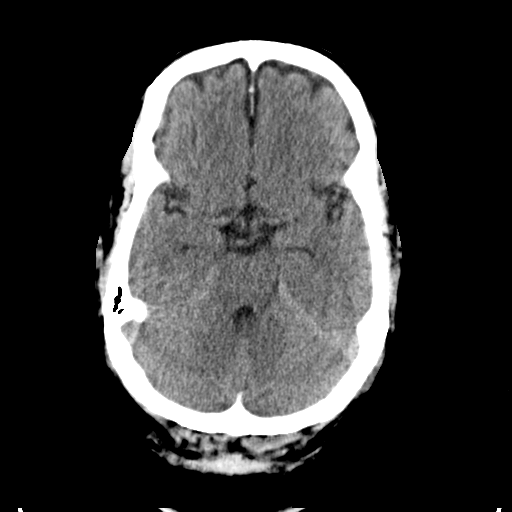
[im 9/30  bone]
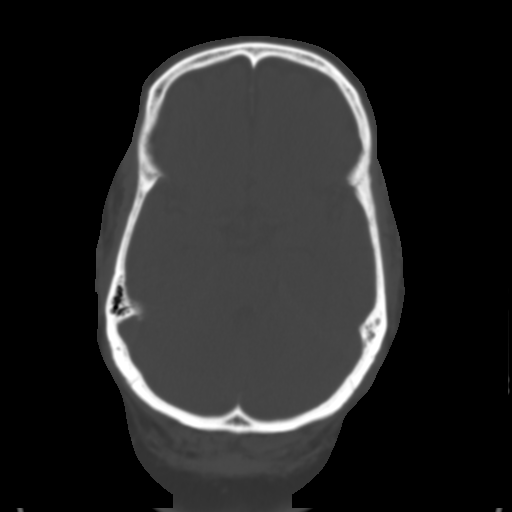
[im 11/30  brain]
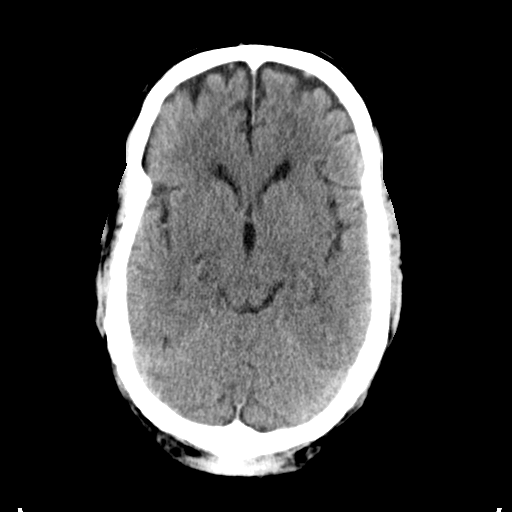
[im 13/30  brain]
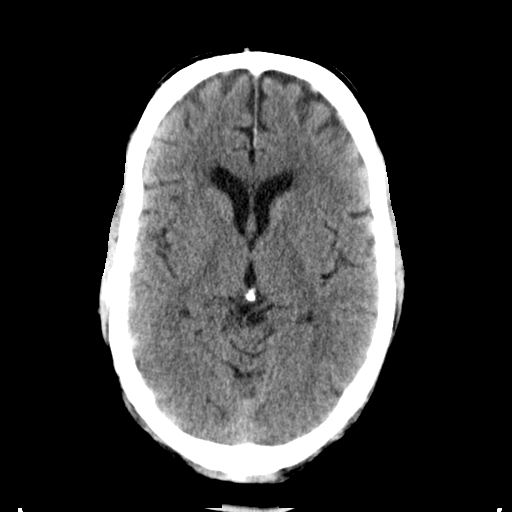
[im 15/30  brain]
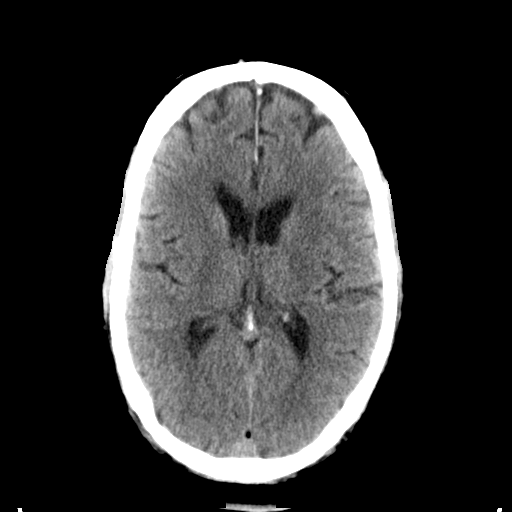
[im 16/30  brain]
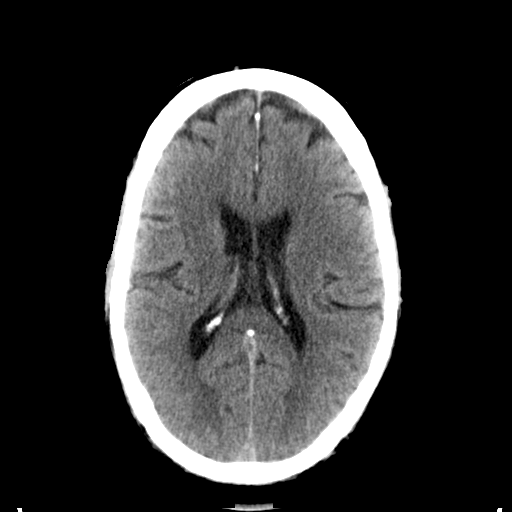
[im 16/30  bone]
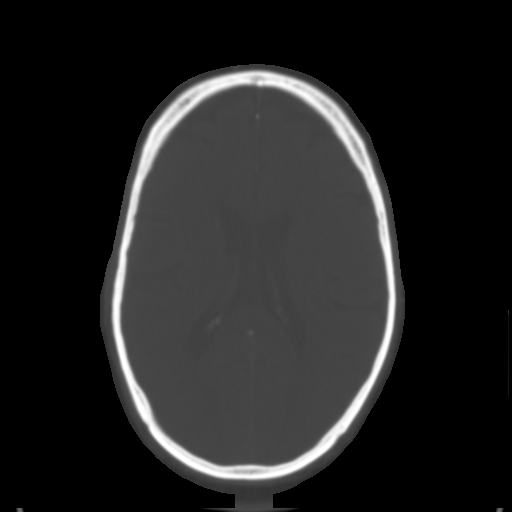
[im 18/30  brain]
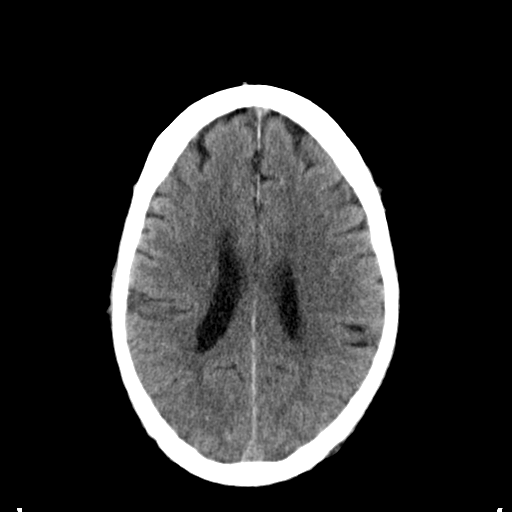
[im 20/30  brain]
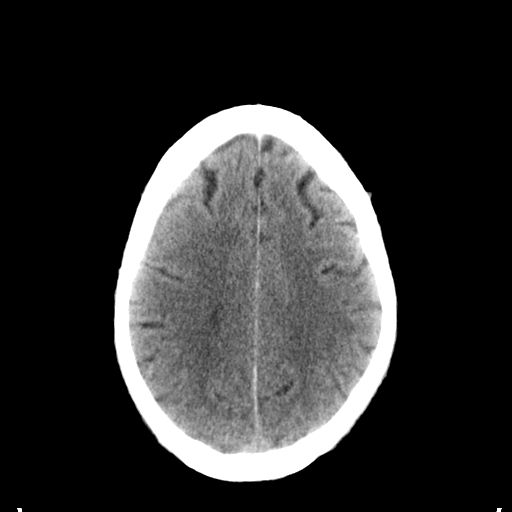
[im 22/30  brain]
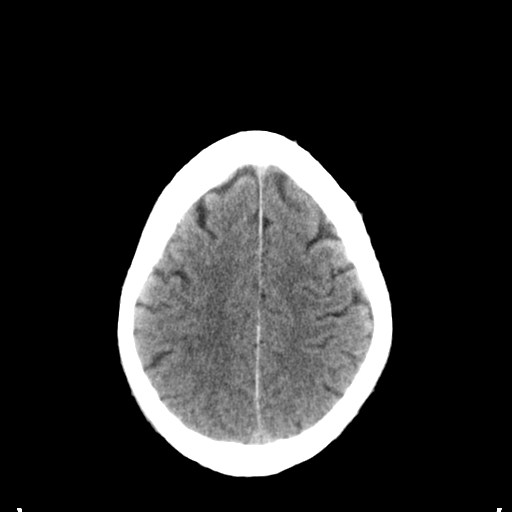
[im 23/30  brain]
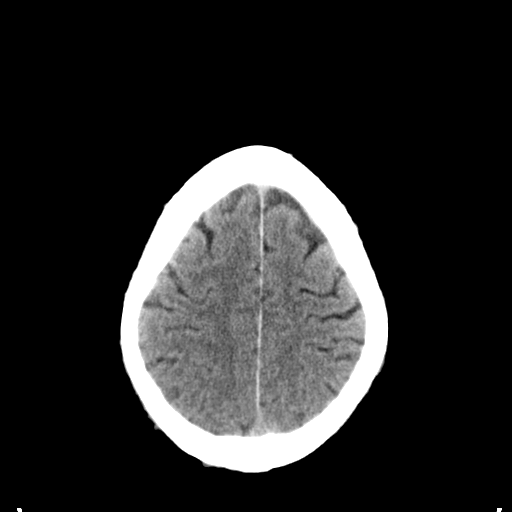
[im 23/30  bone]
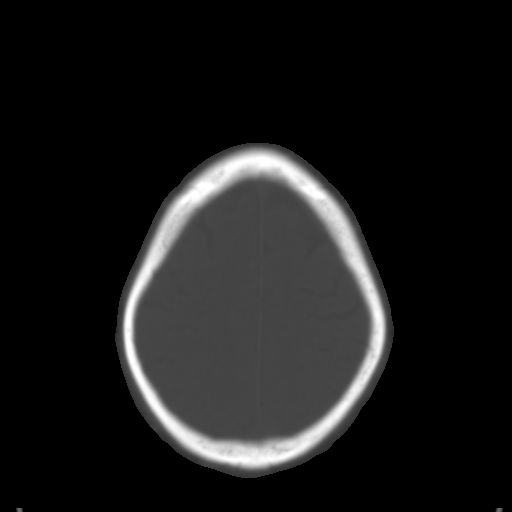
[im 25/30  brain]
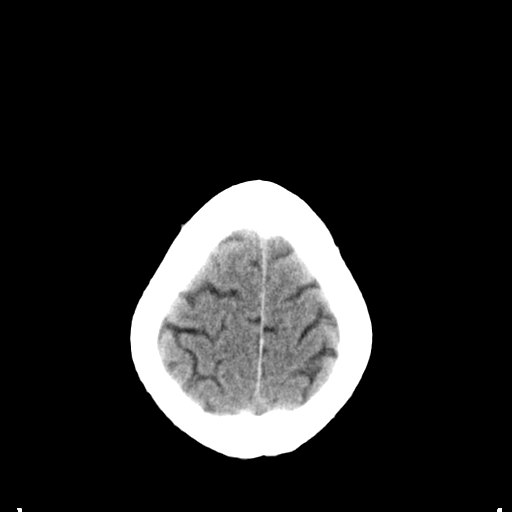
[im 27/30  brain]
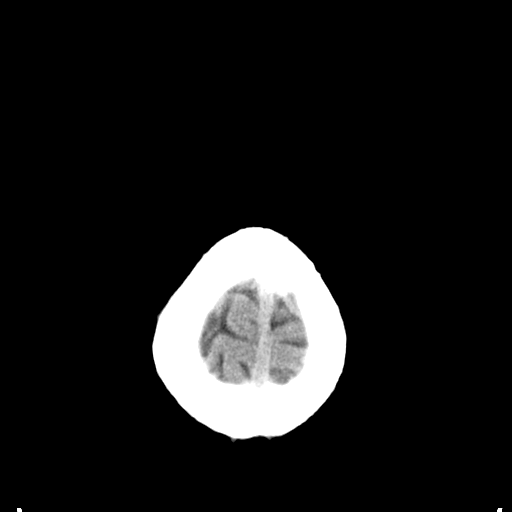
[im 29/30  brain]
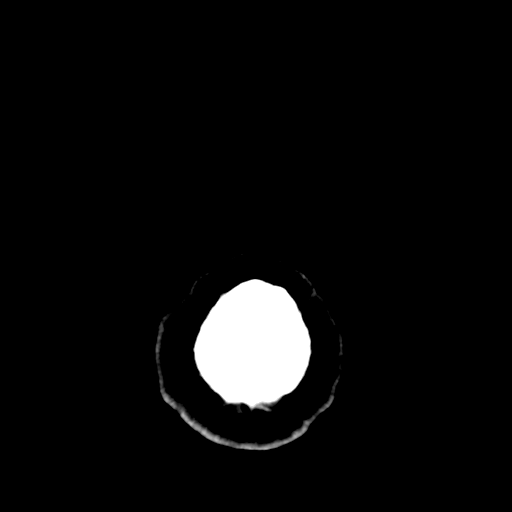

[16 of 30 positions shown; findings below may reference images not displayed]

FINDINGS: Mild diffuse cerebral atrophy. Patchy low-attenuation changes in the
deep white matter consistent small vessel ischemia. No mass effect
or midline shift. No abnormal extra-axial fluid collections.
Gray-white matter junctions are distinct. Basal cisterns are not
effaced. No evidence of acute intracranial hemorrhage. No depressed
skull fractures. Visualized paranasal sinuses and mastoid air cells
are not opacified.
IMPRESSION: No acute intracranial abnormalities. Mild diffuse cerebral atrophy
and small vessel ischemic changes.

## 2016-08-03 IMAGING — CR DG CHEST 1V
1 series · 1 of 1 positions shown · non-contrast
Comparison: 08/26/2014

CLINICAL DATA: Shortness of breath today. History blood pressure
was high. Normally on 2 L oxygen nasal cannula. Oxygen sat is 89%.
History of COPD, hypertension, diabetes, CHF.

EXAM:
CHEST  1 VIEW

[ap]
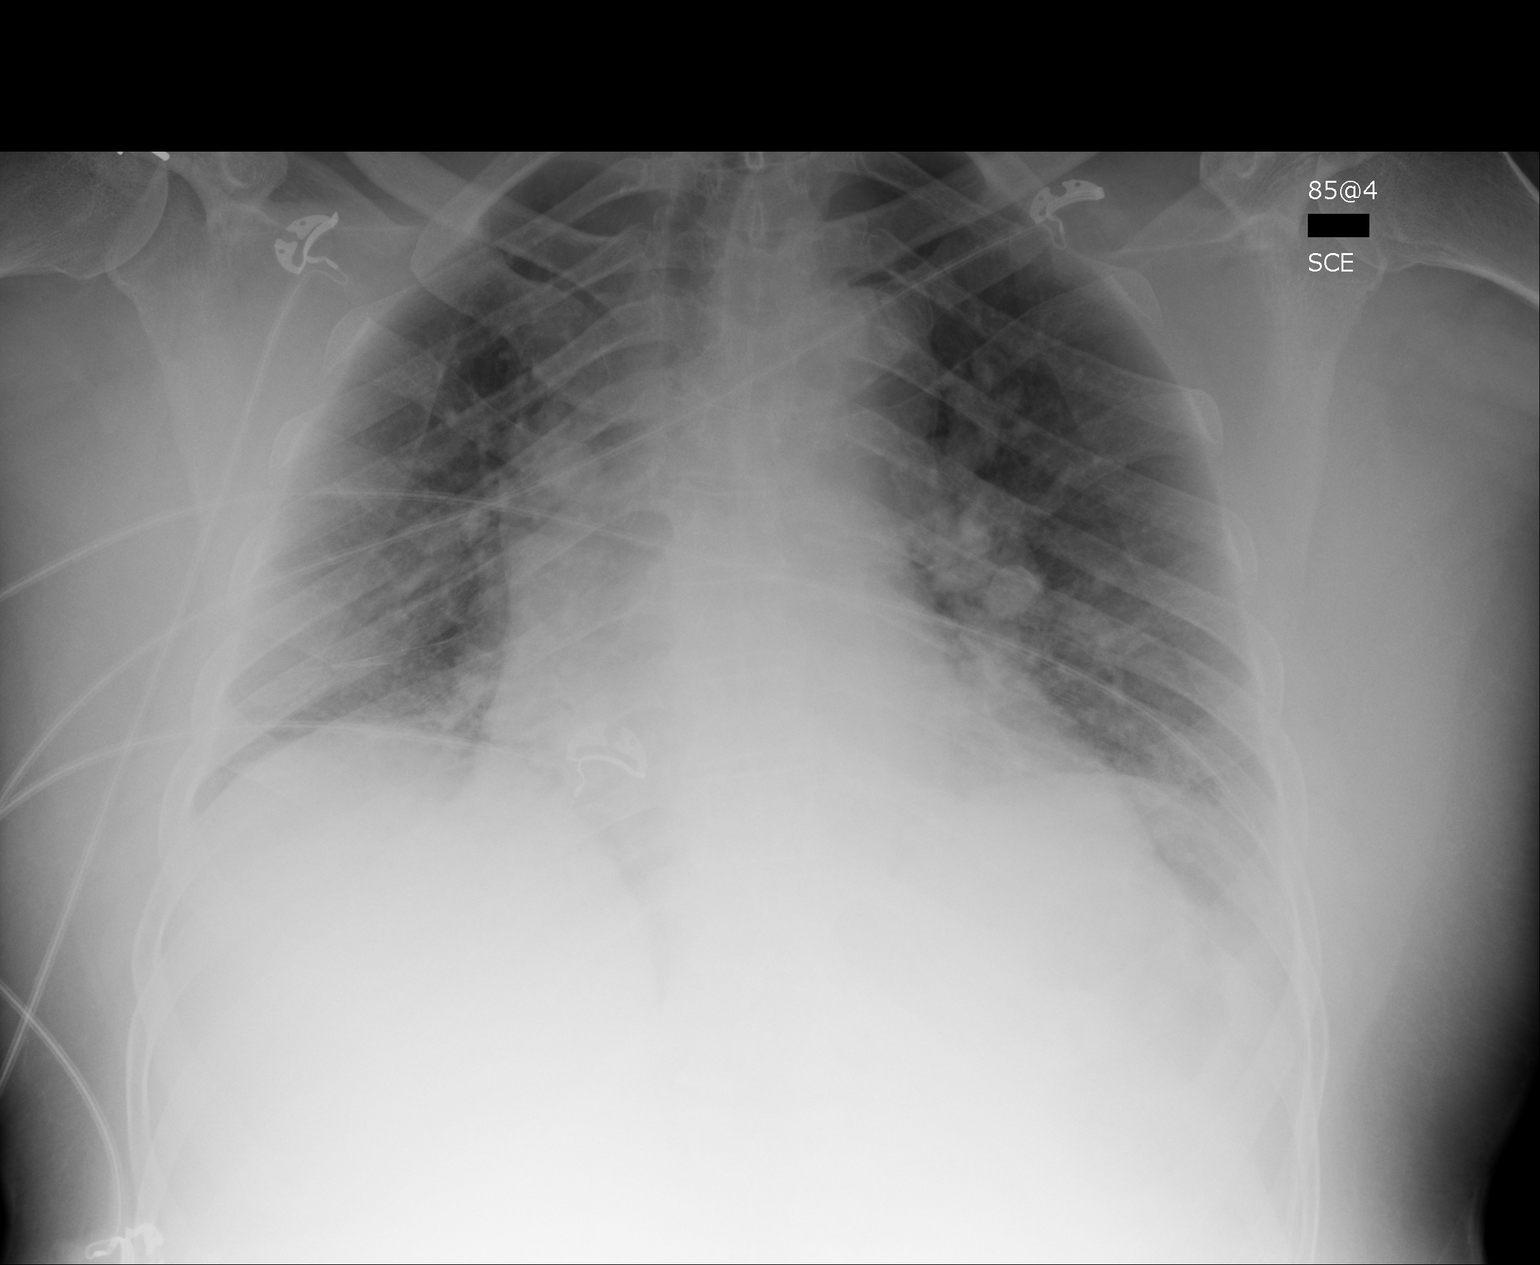

[1 of 1 positions shown; findings below may reference images not displayed]

FINDINGS: Heart is enlarged. Prominent pulmonary arteries segments appear
stable. There are no focal consolidations. No pleural effusions. No
overt edema.
IMPRESSION: Cardiomegaly. No evidence for acute  abnormality.

## 2016-08-05 IMAGING — CR DG CHEST 2V
1 series · 3 of 3 positions shown · non-contrast
Comparison: 03/23/2015 and earlier.

CLINICAL DATA: 69-year-old male with increased shortness breath for
2 days. Initial encounter.

EXAM:
CHEST  2 VIEW

[Series 1: dg chest 2 view · 0.14mm/px · 3 of 3 slices shown]
[im 1/3]
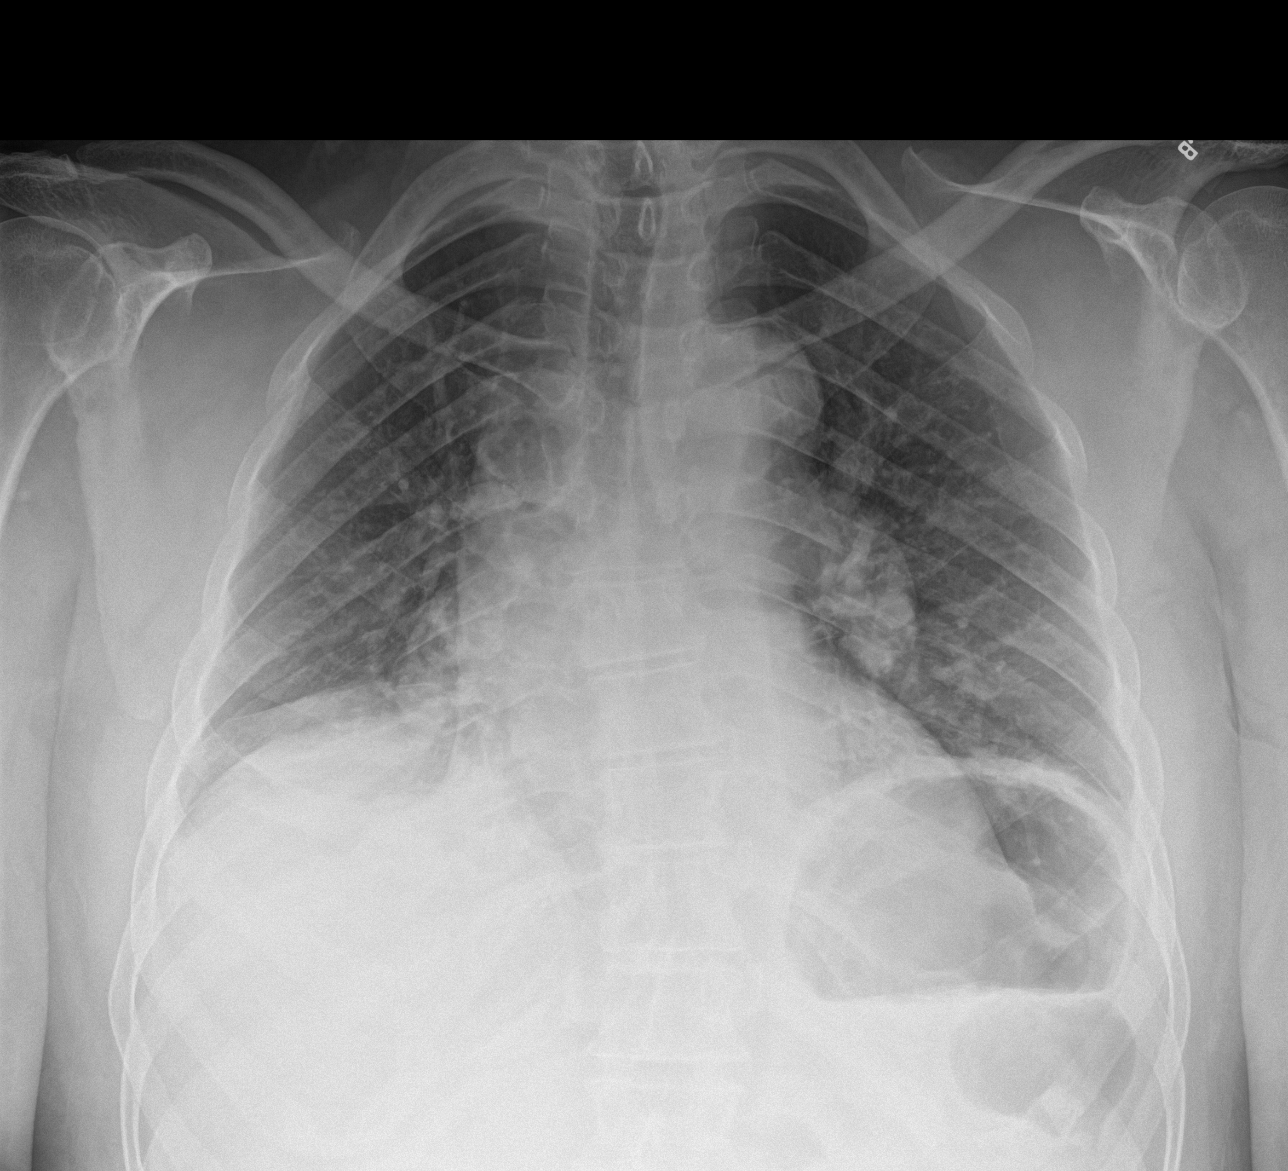
[im 2/3]
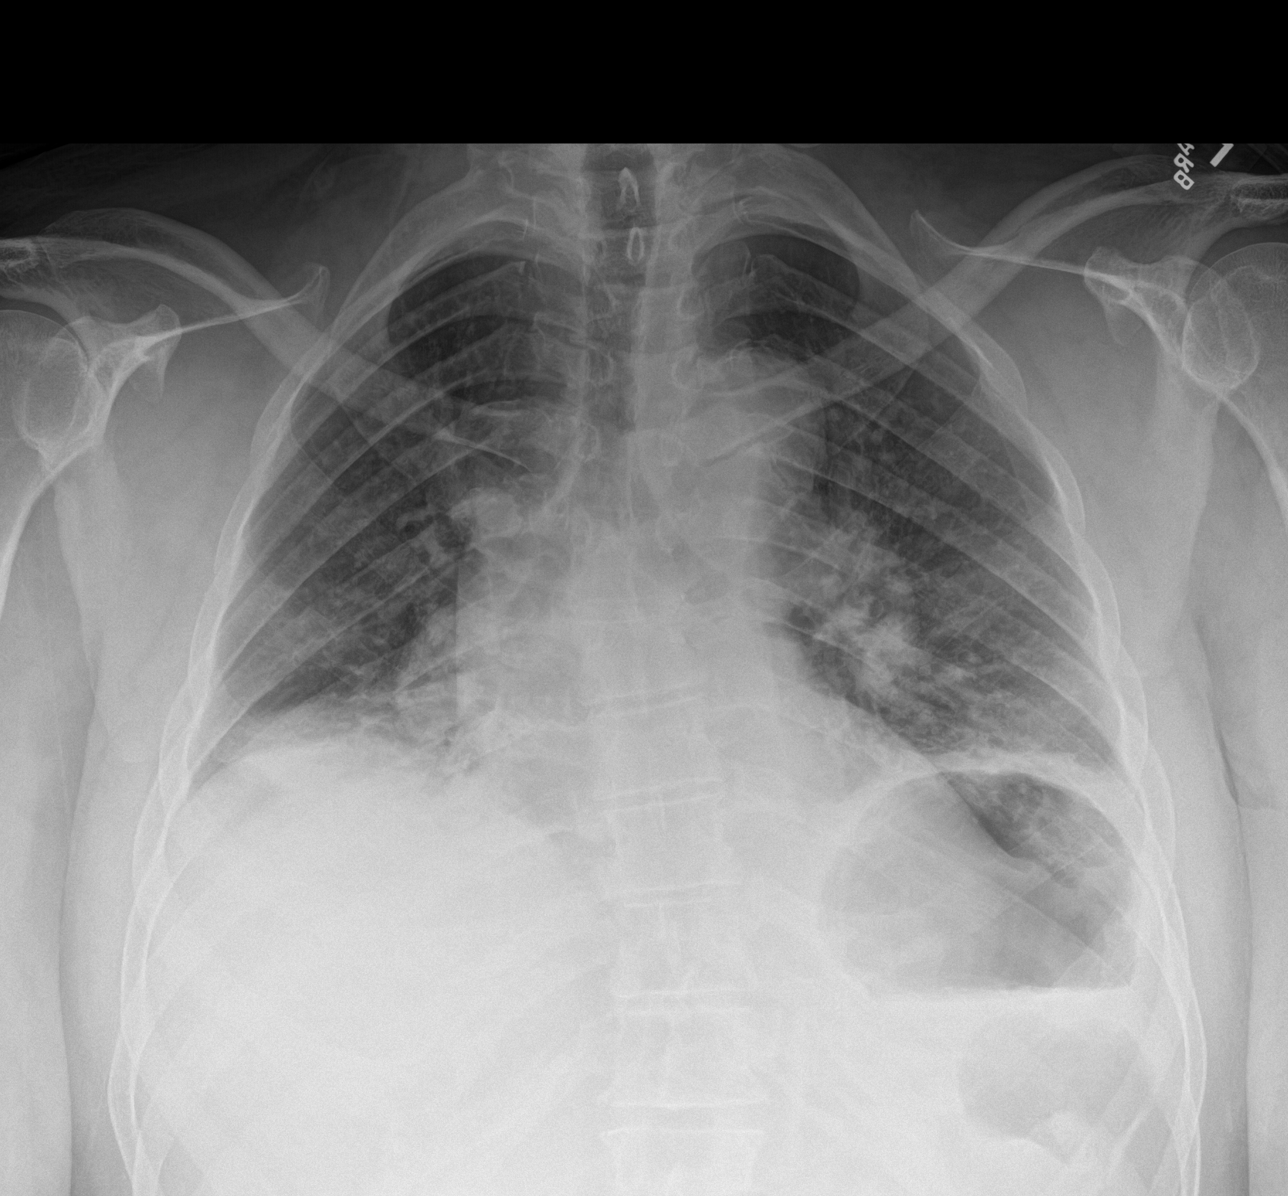
[im 3/3]
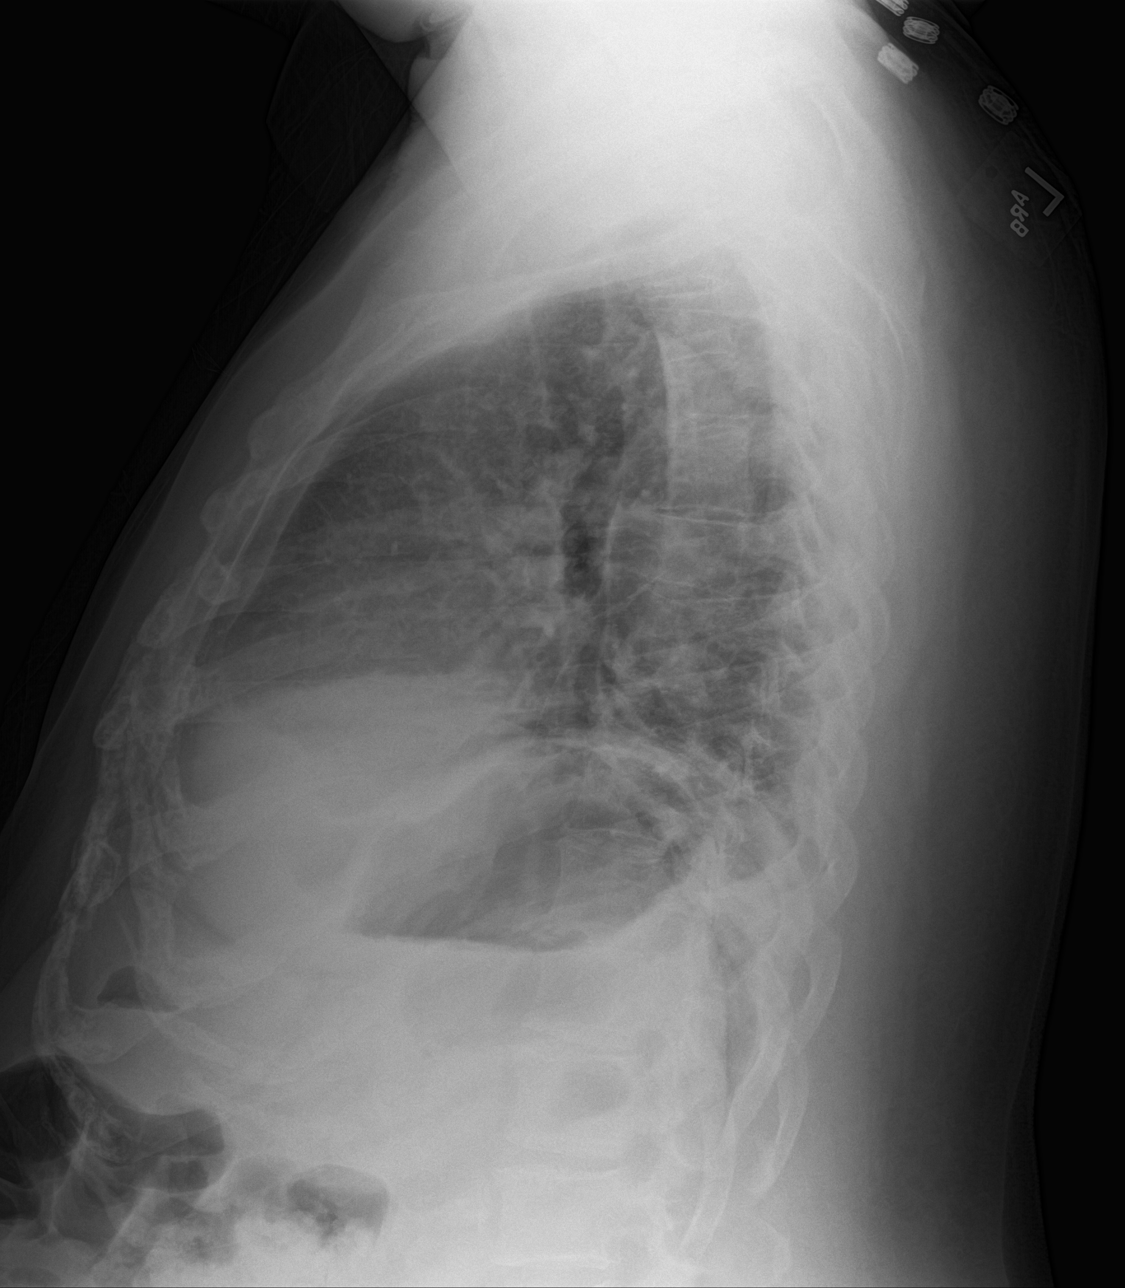

[3 of 3 positions shown; findings below may reference images not displayed]

FINDINGS: Continued low lung volumes. Bibasilar patchy and confluent opacity
greater on the right. No pneumothorax or pleural effusion. No acute
pulmonary edema. Stable cardiac size and mediastinal contours.
Visualized tracheal air column is within normal limits. No acute
osseous abnormality identified.
IMPRESSION: Low lung volumes with right greater than left bibasilar opacity
which most resembles atelectasis. Difficult to exclude right lung
base pneumonia.

## 2019-04-27 ENCOUNTER — Telehealth: Payer: Self-pay

## 2019-04-27 ENCOUNTER — Encounter: Payer: Self-pay | Admitting: Gastroenterology

## 2019-04-27 ENCOUNTER — Other Ambulatory Visit: Payer: Self-pay

## 2019-04-27 DIAGNOSIS — Z1211 Encounter for screening for malignant neoplasm of colon: Secondary | ICD-10-CM

## 2019-04-27 DIAGNOSIS — Z860100 Personal history of colon polyps, unspecified: Secondary | ICD-10-CM

## 2019-04-27 NOTE — Telephone Encounter (Signed)
Gastroenterology Pre-Procedure Review  Request Date: 05/30/19 Requesting Physician: Dr. Allen Norris  PATIENT REVIEW QUESTIONS: The patient responded to the following health history questions as indicated:    1. Are you having any GI issues? no 2. Do you have a personal history of Polyps? yes (5 years ago) 3. Do you have a family history of Colon Cancer or Polyps? yes (brother colon cancer) 4. Diabetes Mellitus? yes (controlled with diet and exercise) 5. Joint replacements in the past 12 months?no 6. Major health problems in the past 3 months?no 7. Any artificial heart valves, MVP, or defibrillator?no    MEDICATIONS & ALLERGIES:    Patient reports the following regarding taking any anticoagulation/antiplatelet therapy:   Plavix, Coumadin, Eliquis, Xarelto, Lovenox, Pradaxa, Brilinta, or Effient? no Aspirin? no  Patient confirms/reports the following medications:  Current Outpatient Medications  Medication Sig Dispense Refill  . albuterol (PROAIR HFA) 108 (90 BASE) MCG/ACT inhaler Inhale 1-2 puffs into the lungs every 6 (six) hours as needed. For wheezing    . ALPHAGAN P 0.15 % ophthalmic solution Place 1 drop into both eyes daily.    Marland Kitchen aspirin EC 81 MG tablet Take 81 mg by mouth daily.    Marland Kitchen atorvastatin (LIPITOR) 40 MG tablet Take 40 mg by mouth at bedtime.    . Cholecalciferol (VITAMIN D-1000 MAX ST) 1000 UNITS tablet Take 1,000 Units by mouth daily.    . furosemide (LASIX) 40 MG tablet Take 40 mg by mouth daily.    . Ipratropium-Albuterol (COMBIVENT) 20-100 MCG/ACT AERS respimat Inhale 1 puff into the lungs 4 (four) times daily as needed. For wheezing and shortness of breath.    . isosorbide mononitrate (IMDUR) 30 MG 24 hr tablet Take 30 mg by mouth daily.    Marland Kitchen latanoprost (XALATAN) 0.005 % ophthalmic solution Place 1 drop into both eyes at bedtime.    Marland Kitchen levofloxacin (LEVAQUIN) 500 MG tablet Take 1 tablet (500 mg total) by mouth daily. 3 tablet 0  . metoprolol (LOPRESSOR) 50 MG tablet Take  1 tablet (50 mg total) by mouth every morning. 30 tablet 0  . predniSONE (STERAPRED UNI-PAK 21 TAB) 10 MG (21) TBPK tablet Take 1 tablet (10 mg total) by mouth daily. Start at 60 mg once daily, taper 10 mg daily until finish 21 tablet 0  . quinapril (ACCUPRIL) 40 MG tablet Take 40 mg by mouth daily.    . ranitidine (ZANTAC) 300 MG tablet Take 300 mg by mouth daily.    Marland Kitchen SPIRIVA HANDIHALER 18 MCG inhalation capsule Place 18 mcg into inhaler and inhale daily.    . SYMBICORT 160-4.5 MCG/ACT inhaler Inhale 2 puffs into the lungs 2 (two) times daily.     No current facility-administered medications for this visit.     Patient confirms/reports the following allergies:  Allergies  Allergen Reactions  . 2,4-D Dimethylamine (Amisol)     Other reaction(s): Hallucination    No orders of the defined types were placed in this encounter.   AUTHORIZATION INFORMATION Primary Insurance: 1D#: Group #:  Secondary Insurance: 1D#: Group #:  SCHEDULE INFORMATION: Date: 05/30/19 Time: Location:[facility]

## 2019-05-25 ENCOUNTER — Other Ambulatory Visit: Payer: Self-pay

## 2019-05-25 MED ORDER — GOLYTELY 236 G PO SOLR: 4000.0000 mL | Freq: Once | ORAL | 0 refills | Status: AC

## 2019-05-26 ENCOUNTER — Other Ambulatory Visit: Payer: Self-pay

## 2019-05-26 ENCOUNTER — Other Ambulatory Visit
Admission: RE | Admit: 2019-05-26 | Discharge: 2019-05-26 | Disposition: A | Discharge status: Home / Self Care | Payer: Medicare Other | Source: Ambulatory Visit | Attending: Gastroenterology | Admitting: Gastroenterology

## 2019-05-26 DIAGNOSIS — Z01812 Encounter for preprocedural laboratory examination: Secondary | ICD-10-CM | POA: Diagnosis present

## 2019-05-26 DIAGNOSIS — Z20828 Contact with and (suspected) exposure to other viral communicable diseases: Secondary | ICD-10-CM | POA: Insufficient documentation

## 2019-05-26 LAB — SARS CORONAVIRUS 2 (TAT 6-24 HRS): SARS Coronavirus 2: NEGATIVE

## 2019-05-29 ENCOUNTER — Encounter: Payer: Self-pay | Admitting: *Deleted

## 2019-05-30 ENCOUNTER — Encounter
Admission: RE | Disposition: A | Discharge status: Home / Self Care | Payer: Self-pay | Source: Home / Self Care | Attending: Gastroenterology

## 2019-05-30 ENCOUNTER — Ambulatory Visit: Payer: Medicare Other | Admitting: Anesthesiology

## 2019-05-30 ENCOUNTER — Ambulatory Visit
Admission: RE | Admit: 2019-05-30 | Discharge: 2019-05-30 | Disposition: A | Discharge status: Home / Self Care | Payer: Medicare Other | Attending: Gastroenterology | Admitting: Gastroenterology

## 2019-05-30 ENCOUNTER — Other Ambulatory Visit: Payer: Self-pay

## 2019-05-30 ENCOUNTER — Encounter: Payer: Self-pay | Admitting: *Deleted

## 2019-05-30 DIAGNOSIS — K5730 Diverticulosis of large intestine without perforation or abscess without bleeding: Secondary | ICD-10-CM | POA: Insufficient documentation

## 2019-05-30 DIAGNOSIS — Z79899 Other long term (current) drug therapy: Secondary | ICD-10-CM | POA: Diagnosis not present

## 2019-05-30 DIAGNOSIS — Z792 Long term (current) use of antibiotics: Secondary | ICD-10-CM | POA: Insufficient documentation

## 2019-05-30 DIAGNOSIS — Z1211 Encounter for screening for malignant neoplasm of colon: Secondary | ICD-10-CM | POA: Diagnosis not present

## 2019-05-30 DIAGNOSIS — J449 Chronic obstructive pulmonary disease, unspecified: Secondary | ICD-10-CM | POA: Insufficient documentation

## 2019-05-30 DIAGNOSIS — K621 Rectal polyp: Secondary | ICD-10-CM

## 2019-05-30 DIAGNOSIS — I509 Heart failure, unspecified: Secondary | ICD-10-CM | POA: Insufficient documentation

## 2019-05-30 DIAGNOSIS — Z860100 Personal history of colon polyps, unspecified: Secondary | ICD-10-CM

## 2019-05-30 DIAGNOSIS — K219 Gastro-esophageal reflux disease without esophagitis: Secondary | ICD-10-CM | POA: Diagnosis not present

## 2019-05-30 DIAGNOSIS — I110 Hypertensive heart disease with heart failure: Secondary | ICD-10-CM | POA: Insufficient documentation

## 2019-05-30 DIAGNOSIS — Z7982 Long term (current) use of aspirin: Secondary | ICD-10-CM | POA: Diagnosis not present

## 2019-05-30 DIAGNOSIS — J9610 Chronic respiratory failure, unspecified whether with hypoxia or hypercapnia: Secondary | ICD-10-CM | POA: Diagnosis not present

## 2019-05-30 DIAGNOSIS — K640 First degree hemorrhoids: Secondary | ICD-10-CM | POA: Insufficient documentation

## 2019-05-30 DIAGNOSIS — H409 Unspecified glaucoma: Secondary | ICD-10-CM | POA: Diagnosis not present

## 2019-05-30 DIAGNOSIS — Z7951 Long term (current) use of inhaled steroids: Secondary | ICD-10-CM | POA: Diagnosis not present

## 2019-05-30 DIAGNOSIS — D128 Benign neoplasm of rectum: Secondary | ICD-10-CM | POA: Insufficient documentation

## 2019-05-30 HISTORY — PX: COLONOSCOPY WITH PROPOFOL: SHX5780

## 2019-05-30 SURGERY — COLONOSCOPY WITH PROPOFOL
Anesthesia: General

## 2019-05-30 MED ORDER — SODIUM CHLORIDE 0.9 % IV SOLN
INTRAVENOUS | Status: DC
  Administered 2019-05-30: 09:00:00 via INTRAVENOUS

## 2019-05-30 MED ORDER — PROPOFOL 500 MG/50ML IV EMUL
INTRAVENOUS | Status: DC | PRN
  Administered 2019-05-30: 150 ug/kg/min via INTRAVENOUS

## 2019-05-30 MED ORDER — PROPOFOL 10 MG/ML IV BOLUS
INTRAVENOUS | Status: DC | PRN
  Administered 2019-05-30: 70 mg via INTRAVENOUS

## 2019-05-30 MED ORDER — SODIUM CHLORIDE 0.9 % IV SOLN
INTRAVENOUS | Status: DC | PRN
  Administered 2019-05-30: 09:00:00 via INTRAVENOUS

## 2019-05-30 MED ORDER — LIDOCAINE HCL (PF) 1 % IJ SOLN
INTRAMUSCULAR | Status: AC
  Filled 2019-05-30: qty 2

## 2019-05-30 NOTE — Op Note (Signed)
[facility] Gastroenterology Patient Name: Manuel Murphy Procedure Date: 05/30/2019 8:36 AM MRN: [id number] Account #: [id number] Date of Birth: 12-18-1945 Admit Type: Outpatient Age: 73 Room: [facility] ENDO ROOM 4 Gender: Male Note Status: Finalized Procedure:             Colonoscopy Indications:           High risk colon cancer surveillance: Personal history                         of colonic polyps Providers:             Lucilla Lame MD, MD Medicines:             Propofol per Anesthesia Complications:         No immediate complications. Procedure:             Pre-Anesthesia Assessment:                        - Prior to the procedure, a History and Physical was                         performed, and patient medications and allergies were                         reviewed. The patient's tolerance of previous                         anesthesia was also reviewed. The risks and benefits                         of the procedure and the sedation options and risks                         were discussed with the patient. All questions were                         answered, and informed consent was obtained. Prior                         Anticoagulants: The patient has taken no previous                         anticoagulant or antiplatelet agents. ASA Grade                         Assessment: II - A patient with mild systemic disease.                         After reviewing the risks and benefits, the patient                         was deemed in satisfactory condition to undergo the                         procedure.                        After obtaining informed consent, the colonoscope was  passed under direct vision. Throughout the procedure,                         the patient's blood pressure, pulse, and oxygen                         saturations were monitored continuously. The                         Colonoscope was introduced through the anus  and                         advanced to the the cecum, identified by appendiceal                         orifice and ileocecal valve. The colonoscopy was                         performed without difficulty. The patient tolerated                         the procedure well. The quality of the bowel                         preparation was excellent. Findings:      The perianal and digital rectal examinations were normal.      A 3 mm polyp was found in the rectum. The polyp was sessile. The polyp       was removed with a cold biopsy forceps. Resection and retrieval were       complete. To prevent bleeding post-intervention, one hemostatic clip was       successfully placed (MR conditional). There was no bleeding at the end       of the procedure.      Multiple small-mouthed diverticula were found in the sigmoid colon.      Non-bleeding internal hemorrhoids were found during retroflexion. The       hemorrhoids were Grade I (internal hemorrhoids that do not prolapse). Impression:            - One 3 mm polyp in the rectum, removed with a cold                         biopsy forceps. Resected and retrieved. Clip (MR                         conditional) was placed.                        - Diverticulosis in the sigmoid colon.                        - Non-bleeding internal hemorrhoids. Recommendation:        - Discharge patient to home.                        - Resume previous diet.                        - Continue present medications.                        -  Await pathology results.                        - Repeat colonoscopy in 5 years for surveillance. Procedure Code(s):     --- Professional ---                        [phone], Colonoscopy, flexible; with biopsy, single or                         multiple Diagnosis Code(s):     --- Professional ---                        Z86.010, Personal history of colonic polyps                        K62.1, Rectal polyp CPT copyright 2019 American Medical  Association. All rights reserved. The codes documented in this report are preliminary and upon coder review may  be revised to meet current compliance requirements. Lucilla Lame MD, MD 05/30/2019 9:14:42 AM This report has been signed electronically. Number of Addenda: 0 Note Initiated On: 05/30/2019 8:36 AM Scope Withdrawal Time: 0 hours 10 minutes 33 seconds  Total Procedure Duration: 0 hours 13 minutes 55 seconds  Estimated Blood Loss:  Estimated blood loss: none.      [facility]

## 2019-05-30 NOTE — Anesthesia Preprocedure Evaluation (Addendum)
Anesthesia Evaluation  Patient identified by MRN, date of birth, ID band Patient awake    Reviewed: Allergy & Precautions, H&P , NPO status , Patient's Chart, lab work & pertinent test results  Airway Mallampati: III  TM Distance: >3 FB Neck ROM: full    Dental  (+) Chipped   Pulmonary sleep apnea, Continuous Positive Airway Pressure Ventilation and Oxygen sleep apnea , COPD, former smoker,  Wears 2L O2 [place] as needed at home CPAP overnight          Cardiovascular hypertension, +CHF    Echo [id number]: MILD LV SYSTOLIC DYSFUNCTION WITH AN ESTIMATED EF = 40-45 % NORMAL RIGHT VENTRICULAR SYSTOLIC FUNCTION MILD TRICUSPID VALVE INSUFFICIENCY TRACE MITRAL AND AORTIC VALVE INSUFFICIENCY NO VALVULAR STENOSIS MILD RV ENLARGEMENT MILD RA ENLARGEMENT MILD LVH   Neuro/Psych negative neurological ROS  negative psych ROS   GI/Hepatic Neg liver ROS, GERD  Controlled,  Endo/Other  negative endocrine ROS  Renal/GU negative Renal ROS  negative genitourinary   Musculoskeletal   Abdominal   Peds  Hematology negative hematology ROS (+)   Anesthesia Other Findings Past Medical History: No date: CHF (congestive heart failure) (HCC) No date: Chronic respiratory failure (HCC) No date: COPD (chronic obstructive pulmonary disease) (HCC) No date: GERD (gastroesophageal reflux disease) No date: Glaucoma No date: Hypertension  Past Surgical History: No date: JOINT REPLACEMENT No date: TOTAL KNEE ARTHROPLASTY  BMI    Body Mass Index: 34.45 kg/m      Reproductive/Obstetrics negative OB ROS                           Anesthesia Physical Anesthesia Plan  ASA: III  Anesthesia Plan: General   Post-op Pain Management:    Induction:   PONV Risk Score and Plan: Propofol infusion and TIVA  Airway Management Planned: Natural Airway and Nasal Cannula  Additional Equipment:   Intra-op Plan:    Post-operative Plan:   Informed Consent: I have reviewed the patients History and Physical, chart, labs and discussed the procedure including the risks, benefits and alternatives for the proposed anesthesia with the patient or authorized representative who has indicated his/her understanding and acceptance.     Dental Advisory Given  Plan Discussed with: Anesthesiologist, CRNA and Surgeon  Anesthesia Plan Comments:         Anesthesia Quick Evaluation

## 2019-05-30 NOTE — Transfer of Care (Signed)
Immediate Anesthesia Transfer of Care Note  Patient: Manuel Murphy  Procedure(s) Performed: COLONOSCOPY WITH PROPOFOL (N/A )  Patient Location: PACU  Anesthesia Type:General  Level of Consciousness: sedated  Airway & Oxygen Therapy: Patient Spontanous Breathing and Patient connected to nasal cannula oxygen  Post-op Assessment: Report given to RN and Post -op Vital signs reviewed and stable  Post vital signs: Reviewed and stable  Last Vitals:  Vitals Value Taken Time  BP 101/67 05/30/19 0917  Temp 36.4 C 05/30/19 0917  Pulse 57 05/30/19 0917  Resp 17 05/30/19 0917  SpO2 93 % 05/30/19 0917  Vitals shown include unvalidated device data.  Last Pain:  Vitals:   05/30/19 0916  TempSrc: Tympanic  PainSc: Asleep         Complications: No apparent anesthesia complications

## 2019-05-30 NOTE — Anesthesia Post-op Follow-up Note (Signed)
Anesthesia QCDR form completed.        

## 2019-05-30 NOTE — H&P (Signed)
Lucilla Lame, MD [facility] [address] [place], [place] [address] Phone:[phone] Fax : [phone]  Primary Care Physician:  Kerri Perches, PA-C Primary Gastroenterologist:  Dr. Allen Norris  Pre-Procedure History & Physical: HPI:  Manuel Murphy is a 73 y.o. male is here for an colonoscopy.   Past Medical History:  Diagnosis Date  . CHF (congestive heart failure) ([place])   . Chronic respiratory failure ([place])   . COPD (chronic obstructive pulmonary disease) ([place])   . GERD (gastroesophageal reflux disease)   . Glaucoma   . Hypertension       Prior to Admission medications   Medication Sig Start Date End Date Taking? Authorizing Provider  albuterol (PROAIR HFA) 108 (90 BASE) MCG/ACT inhaler Inhale 1-2 puffs into the lungs every 6 (six) hours as needed. For wheezing 10/17/14   [provider]  ALPHAGAN P 0.15 % ophthalmic solution Place 1 drop into both eyes daily. 12/25/14   [provider]  aspirin EC 81 MG tablet Take 81 mg by mouth daily.    [provider]  atorvastatin (LIPITOR) 40 MG tablet Take 40 mg by mouth at bedtime. 02/21/15   [provider]  Cholecalciferol (VITAMIN D-1000 MAX ST) 1000 UNITS tablet Take 1,000 Units by mouth daily.    [provider]  furosemide (LASIX) 40 MG tablet Take 40 mg by mouth daily.    [provider]  Ipratropium-Albuterol (COMBIVENT) 20-100 MCG/ACT AERS respimat Inhale 1 puff into the lungs 4 (four) times daily as needed. For wheezing and shortness of breath.    [provider]  isosorbide mononitrate (IMDUR) 30 MG 24 hr tablet Take 30 mg by mouth daily. 12/25/14   [provider]  latanoprost (XALATAN) 0.005 % ophthalmic solution Place 1 drop into both eyes at bedtime. 12/29/14   [provider]  levofloxacin (LEVAQUIN) 500 MG tablet Take 1 tablet (500 mg total) by mouth daily. 03/25/15   Max Sane, MD  metoprolol (LOPRESSOR) 50 MG tablet Take 1 tablet (50 mg total)  by mouth every morning. 03/25/15   Max Sane, MD  predniSONE (STERAPRED UNI-PAK 21 TAB) 10 MG (21) TBPK tablet Take 1 tablet (10 mg total) by mouth daily. Start at 60 mg once daily, taper 10 mg daily until finish 03/25/15   Max Sane, MD  quinapril (ACCUPRIL) 40 MG tablet Take 40 mg by mouth daily.    [provider]  ranitidine (ZANTAC) 300 MG tablet Take 300 mg by mouth daily.    [provider]  SPIRIVA HANDIHALER 18 MCG inhalation capsule Place 18 mcg into inhaler and inhale daily. 12/24/14   [provider]  SYMBICORT 160-4.5 MCG/ACT inhaler Inhale 2 puffs into the lungs 2 (two) times daily. 02/21/15   [provider]    Allergies as of 04/27/2019 - Review Complete 03/23/2015  Allergen Reaction Noted  . 2,4-d dimethylamine (amisol)  03/23/2015    No family history on file.  Social History   Socioeconomic History  . Marital status: Married    Spouse name: Not on file  . Number of children: Not on file  . Years of education: Not on file  . Highest education level: Not on file  Occupational History  . Not on file  Social Needs  . Financial resource strain: Not on file  . Food insecurity    Worry: Not on file    Inability: Not on file  . Transportation needs    Medical: Not on file    Non-medical:  Not on file  Tobacco Use  . Smoking status: Never Smoker  Substance and Sexual Activity  . Alcohol use: No  . Drug use: Not on file  . Sexual activity: Not on file  Lifestyle  . Physical activity    Days per week: Not on file    Minutes per session: Not on file  . Stress: Not on file  Relationships  . Social Herbalist on phone: Not on file    Gets together: Not on file    Attends religious service: Not on file    Active member of club or organization: Not on file    Attends meetings of clubs or organizations: Not on file    Relationship status: Not on file  . Intimate partner violence    Fear of current or ex partner: Not on  file    Emotionally abused: Not on file    Physically abused: Not on file    Forced sexual activity: Not on file  Other Topics Concern  . Not on file  Social History Narrative  . Not on file    Review of Systems: See HPI, otherwise negative ROS  Physical Exam: BP (!) 147/82   Pulse 61   Temp (!) 97 F (36.1 C) (Tympanic)   Resp 17   Ht 5\' 5"  (1.651 m)   Wt 93.9 kg   SpO2 96%   BMI 34.45 kg/m  General:   Alert,  pleasant and cooperative in NAD Head:  Normocephalic and atraumatic. Neck:  Supple; no masses or thyromegaly. Lungs:  Clear throughout to auscultation.    Heart:  Regular rate and rhythm. Abdomen:  Soft, nontender and nondistended. Normal bowel sounds, without guarding, and without rebound.   Neurologic:  Alert and  oriented x4;  grossly normal neurologically.  Impression/Plan: Manuel Murphy is here for an colonoscopy to be performed for history of adenomatous colon polyps 09/28/2013  Risks, benefits, limitations, and alternatives regarding  colonoscopy have been reviewed with the patient.  Questions have been answered.  All parties agreeable.   Lucilla Lame, MD  05/30/2019, 7:58 AM

## 2019-05-31 ENCOUNTER — Encounter: Payer: Self-pay | Admitting: Gastroenterology

## 2019-05-31 LAB — SURGICAL PATHOLOGY

## 2019-06-01 NOTE — Anesthesia Postprocedure Evaluation (Signed)
Anesthesia Post Note  Patient: Manuel Murphy  Procedure(s) Performed: COLONOSCOPY WITH PROPOFOL (N/A )  Patient location during evaluation: PACU Anesthesia Type: General Level of consciousness: awake and alert Pain management: pain level controlled Vital Signs Assessment: post-procedure vital signs reviewed and stable Respiratory status: spontaneous breathing, nonlabored ventilation and respiratory function stable Cardiovascular status: blood pressure returned to baseline and stable Postop Assessment: no apparent nausea or vomiting Anesthetic complications: no     Last Vitals:  Vitals:   05/30/19 0926 05/30/19 0946  BP: 108/72 (!) 152/87  Pulse:    Resp: 16   Temp:    SpO2: 98%     Last Pain:  Vitals:   05/30/19 0946  TempSrc:   PainSc: 0-No pain                 Durenda Hurt

## 2020-01-23 ENCOUNTER — Other Ambulatory Visit: Payer: Self-pay

## 2020-01-23 ENCOUNTER — Encounter: Payer: Self-pay | Admitting: Emergency Medicine

## 2020-01-23 ENCOUNTER — Emergency Department
Admission: EM | Admit: 2020-01-23 | Discharge: 2020-01-23 | Disposition: A | Discharge status: Home / Self Care | Payer: Medicare Other | Attending: Emergency Medicine | Admitting: Emergency Medicine

## 2020-01-23 DIAGNOSIS — S51831A Puncture wound without foreign body of right forearm, initial encounter: Secondary | ICD-10-CM | POA: Diagnosis present

## 2020-01-23 DIAGNOSIS — W228XXA Striking against or struck by other objects, initial encounter: Secondary | ICD-10-CM | POA: Diagnosis not present

## 2020-01-23 DIAGNOSIS — Y939 Activity, unspecified: Secondary | ICD-10-CM | POA: Diagnosis not present

## 2020-01-23 DIAGNOSIS — Z79899 Other long term (current) drug therapy: Secondary | ICD-10-CM | POA: Diagnosis not present

## 2020-01-23 DIAGNOSIS — Y999 Unspecified external cause status: Secondary | ICD-10-CM | POA: Diagnosis not present

## 2020-01-23 DIAGNOSIS — J449 Chronic obstructive pulmonary disease, unspecified: Secondary | ICD-10-CM | POA: Insufficient documentation

## 2020-01-23 DIAGNOSIS — I10 Essential (primary) hypertension: Secondary | ICD-10-CM | POA: Diagnosis not present

## 2020-01-23 DIAGNOSIS — Y929 Unspecified place or not applicable: Secondary | ICD-10-CM | POA: Insufficient documentation

## 2020-01-23 DIAGNOSIS — Z87891 Personal history of nicotine dependence: Secondary | ICD-10-CM | POA: Diagnosis not present

## 2020-01-23 MED ORDER — CEPHALEXIN 500 MG PO CAPS: 500.0000 mg | ORAL_CAPSULE | Freq: Three times a day (TID) | ORAL | 0 refills | Status: DC

## 2020-01-23 NOTE — ED Provider Notes (Signed)
[facility] Emergency Department Provider Note   ____________________________________________   First MD Initiated Contact with Patient 01/23/20 1031     (approximate)  I have reviewed the triage vital signs and the nursing notes.   HISTORY  Chief Complaint Foreign Body in Skin    HPI Manuel Murphy is a 74 y.o. male presents to the ED with possible splinter to his right forearm.  Patient states that he was leaning up against a wooden deck when he felt something going in his arm.  He states that he pulled out a splinter approximately 1 inch.  Patient states this morning the area is red.  He is up-to-date on immunizations.  He rates pain as 0/10.       Past Medical History:  Diagnosis Date  . CHF (congestive heart failure) ([place])   . Chronic respiratory failure ([place])   . COPD (chronic obstructive pulmonary disease) ([place])   . GERD (gastroesophageal reflux disease)   . Glaucoma   . Hypertension     Patient Active Problem List   Diagnosis Date Noted  . History of colonic polyps   . Rectal polyp   . COPD (chronic obstructive pulmonary disease) ([place]) 03/23/2015    Past Surgical History:  Procedure Laterality Date  . COLONOSCOPY WITH PROPOFOL N/A 05/30/2019   Procedure: COLONOSCOPY WITH PROPOFOL;  Surgeon: Lucilla Lame, MD;  Location: [facility] ENDOSCOPY;  Service: Endoscopy;  Laterality: N/A;  . JOINT REPLACEMENT    . TOTAL KNEE ARTHROPLASTY      Prior to Admission medications   Medication Sig Start Date End Date Taking? Authorizing Provider  albuterol (PROAIR HFA) 108 (90 BASE) MCG/ACT inhaler Inhale 1-2 puffs into the lungs every 6 (six) hours as needed. For wheezing 10/17/14   [provider]  ALPHAGAN P 0.15 % ophthalmic solution Place 1 drop into both eyes daily. 12/25/14   [provider]  aspirin EC 81 MG tablet Take 81 mg by mouth daily.    [provider]  atorvastatin (LIPITOR) 40 MG tablet Take 40 mg by mouth at  bedtime. 02/21/15   [provider]  cephALEXin (KEFLEX) 500 MG capsule Take 1 capsule (500 mg total) by mouth 3 (three) times daily. 01/23/20   Johnn Hai, PA-C  Cholecalciferol (VITAMIN D-1000 MAX ST) 1000 UNITS tablet Take 1,000 Units by mouth daily.    [provider]  furosemide (LASIX) 40 MG tablet Take 40 mg by mouth daily.    [provider]  Ipratropium-Albuterol (COMBIVENT) 20-100 MCG/ACT AERS respimat Inhale 1 puff into the lungs 4 (four) times daily as needed. For wheezing and shortness of breath.    [provider]  isosorbide mononitrate (IMDUR) 30 MG 24 hr tablet Take 30 mg by mouth daily. 12/25/14   [provider]  latanoprost (XALATAN) 0.005 % ophthalmic solution Place 1 drop into both eyes at bedtime. 12/29/14   [provider]  levofloxacin (LEVAQUIN) 500 MG tablet Take 1 tablet (500 mg total) by mouth daily. Patient not taking: Reported on 05/30/2019 03/25/15   Max Sane, MD  metoprolol (LOPRESSOR) 50 MG tablet Take 1 tablet (50 mg total) by mouth every morning. 03/25/15   Max Sane, MD  quinapril (ACCUPRIL) 40 MG tablet Take 40 mg by mouth daily.    [provider]  ranitidine (ZANTAC) 300 MG tablet Take 300 mg by mouth daily.    [provider]  SPIRIVA HANDIHALER 18 MCG inhalation capsule Place 18 mcg into inhaler and inhale  daily. 12/24/14   [provider]  SYMBICORT 160-4.5 MCG/ACT inhaler Inhale 2 puffs into the lungs 2 (two) times daily. 02/21/15   [provider]    Allergies 2,4-d dimethylamine (amisol) and Morphine and related  No family history on file.  Social History Social History   Tobacco Use  . Smoking status: Former Research scientist (life sciences)  . Smokeless tobacco: Former Systems developer  . Tobacco comment: "I quit in 99."  Vaping Use  . Vaping Use: Never used  Substance Use Topics  . Alcohol use: Not Currently    Comment: "glass of wine twice a year"  . Drug use: Never    Review of  Systems Constitutional: No fever/chills Eyes: No visual changes. Cardiovascular: Denies chest pain. Respiratory: Denies shortness of breath. Musculoskeletal: Negative for back pain. Skin: Puncture wound. Neurological: Negative for headaches, focal weakness or numbness. Endocrine:  Diabetes mellitus type 2 ____________________________________________   PHYSICAL EXAM:  VITAL SIGNS: ED Triage Vitals  Enc Vitals Group     BP 01/23/20 1030 137/68     Pulse Rate 01/23/20 1030 88     Resp 01/23/20 1030 18     Temp 01/23/20 1030 98 F (36.7 C)     Temp Source 01/23/20 1030 Oral     SpO2 01/23/20 1030 99 %     Weight 01/23/20 1027 205 lb (93 kg)     Height 01/23/20 1027 5\' 4"  (1.626 m)     Head Circumference --      Peak Flow --      Pain Score 01/23/20 1026 0     Pain Loc --      Pain Edu? --      Excl. in [place]? --     Constitutional: Alert and oriented. Well appearing and in no acute distress. Eyes: Conjunctivae are normal.  Head: Atraumatic. Neck: No stridor.   Cardiovascular: Normal rate, regular rhythm. Grossly normal heart sounds.  Good peripheral circulation. Respiratory: Normal respiratory effort.  No retractions. Lungs CTAB. Musculoskeletal: Moves upper and lower extremities any difficulty and normal gait is noted with the assistance of a cane. Neurologic:  Normal speech and language. No gross focal neurologic deficits are appreciated. No gait instability. Skin:  Skin is warm, dry and intact.  2.0 cm erythematous area to the right forearm.  There is a small pinpoint scab noted in this area.  No active drainage is present. Psychiatric: Mood and affect are normal. Speech and behavior are normal.  ____________________________________________   LABS (all labs ordered are listed, but only abnormal results are displayed)  Labs Reviewed - No data to display  PROCEDURES  Procedure(s) performed (including Critical Care):  Procedures  Area was cleaned and prepped.  The  single scab was unroofed with minimal to no purulent drainage but no bleeding.  Area was probed with a splinter forcep and no foreign body was noted.  On palpation there was no sensation that a foreign body remained. ____________________________________________   INITIAL IMPRESSION / ASSESSMENT AND PLAN / ED COURSE  As part of my medical decision making, I reviewed the following data within the electronic MEDICAL RECORD NUMBER Notes from prior ED visits and [place] Controlled Substance Database  74 year old male presents to the ED with complaint of possible splinter in his right forearm that happened several days ago.  Patient states that he did pull out a wooden splinter that measures approximately 1 inch.  He states that the area has become red but denies any purulent drainage.  He denies any  fever or chills.  Area was probed and no foreign body was removed.  There was a minimal amount of purulent drainage when the area was unroofed.  Patient was started on Keflex 5 mg 3 times daily for 7 days.  He is to continue watching this area and if any continued problems or not improving he is to return to the emergency department.  ____________________________________________   FINAL CLINICAL IMPRESSION(S) / ED DIAGNOSES  Final diagnoses:  Puncture wound of right forearm, initial encounter     ED Discharge Orders         Ordered    cephALEXin (KEFLEX) 500 MG capsule  3 times daily     Discontinue  Reprint     01/23/20 1056           Note:  This document was prepared using Dragon voice recognition software and may include unintentional dictation errors.    Johnn Hai, PA-C 01/23/20 1404    Nena Polio, MD 01/23/20 [phone]

## 2020-01-23 NOTE — ED Triage Notes (Signed)
Presents with possible splinter in right f/a  States he leaned up against the deck

## 2020-01-23 NOTE — Discharge Instructions (Addendum)
Follow-up with your primary care provider if any continued problems.  Return to the emergency department if any worsening of this area.  Begin using warm, moist compresses to the area frequently.  A prescription for an antibiotic was sent to your pharmacy and you should begin taking it today, 3 times a day until completely finished.

## 2020-10-25 ENCOUNTER — Encounter: Payer: Self-pay | Admitting: Urology

## 2020-10-25 ENCOUNTER — Other Ambulatory Visit: Payer: Self-pay

## 2020-10-25 ENCOUNTER — Ambulatory Visit (INDEPENDENT_AMBULATORY_CARE_PROVIDER_SITE_OTHER): Payer: Medicare Other | Admitting: Urology

## 2020-10-25 VITALS — BP 158/76 | HR 67 | Ht 66.0 in | Wt 204.0 lb

## 2020-10-25 DIAGNOSIS — N400 Enlarged prostate without lower urinary tract symptoms: Secondary | ICD-10-CM

## 2020-10-25 DIAGNOSIS — Z8042 Family history of malignant neoplasm of prostate: Secondary | ICD-10-CM | POA: Diagnosis not present

## 2020-10-25 LAB — MICROSCOPIC EXAMINATION
Bacteria, UA: NONE SEEN
RBC, Urine: NONE SEEN /hpf (ref 0–2)
WBC, UA: NONE SEEN /hpf (ref 0–5)

## 2020-10-25 LAB — URINALYSIS, COMPLETE
Bilirubin, UA: NEGATIVE
Glucose, UA: NEGATIVE
Ketones, UA: NEGATIVE
Leukocytes,UA: NEGATIVE
Nitrite, UA: NEGATIVE
Protein,UA: NEGATIVE
RBC, UA: NEGATIVE
Specific Gravity, UA: 1.01 (ref 1.005–1.030)
Urobilinogen, Ur: 0.2 mg/dL (ref 0.2–1.0)
pH, UA: 5 (ref 5.0–7.5)

## 2020-10-25 NOTE — Progress Notes (Signed)
10/25/2020 12:24 PM   Manuel Murphy 07-Jul-1946 [id number]  Referring provider: Gladstone Pih, NP [address] [place],  [place] [address]  Chief Complaint  Patient presents with  . Prostate Cancer    HPI: Manuel Murphy is a 75 y.o. male referred for prostate cancer screening.   Twin brother recently diagnosed with prostate cancer and he requested urology referral  No PSA results seen on review of Epic  Primary care at [facility] and he is unsure if he has had PSA testing  States he was treated for a UTI approximately 2 months ago and symptoms resolved with antibiotic  No bothersome LUTS  Denies dysuria, gross hematuria  Denies flank, abdominal or pelvic pain   PMH: Past Medical History:  Diagnosis Date  . CHF (congestive heart failure) ([place])   . Chronic respiratory failure ([place])   . COPD (chronic obstructive pulmonary disease) ([place])   . GERD (gastroesophageal reflux disease)   . Glaucoma   . Hypertension     Surgical History: Past Surgical History:  Procedure Laterality Date  . COLONOSCOPY WITH PROPOFOL N/A 05/30/2019   Procedure: COLONOSCOPY WITH PROPOFOL;  Surgeon: Lucilla Lame, MD;  Location: [facility] ENDOSCOPY;  Service: Endoscopy;  Laterality: N/A;  . JOINT REPLACEMENT    . TOTAL KNEE ARTHROPLASTY      Home Medications:  Allergies as of 10/25/2020      Reactions   2,4-d Dimethylamine (amisol)    Other reaction(s): Hallucination   Limonene    Other reaction(s): Hallucination Other reaction(s): Hallucination   Morphine And Related Anaphylaxis      Medication List       Accurate as of October 25, 2020 12:24 PM. If you have any questions, ask your nurse or doctor.        STOP taking these medications   cephALEXin 500 MG capsule Commonly known as: KEFLEX Stopped by: Abbie Sons, MD   levofloxacin 500 MG tablet Commonly known as: Levaquin Stopped by: Abbie Sons, MD     TAKE these medications   Alphagan P 0.15 % ophthalmic  solution Generic drug: brimonidine Place 1 drop into both eyes daily.   amLODipine 10 MG tablet Commonly known as: NORVASC Take 1 tablet by mouth daily.   aspirin EC 81 MG tablet Take 81 mg by mouth daily.   atorvastatin 40 MG tablet Commonly known as: LIPITOR Take 40 mg by mouth at bedtime.   brimonidine-timolol 0.2-0.5 % ophthalmic solution Commonly known as: COMBIGAN Apply 1 drop to eye 2 (two) times daily.   cetirizine 10 MG tablet Commonly known as: ZYRTEC Take by mouth.   Cialis 20 MG tablet Generic drug: tadalafil Take 1 tablet by mouth once a day as needed as needed prior to need   dorzolamide 2 % ophthalmic solution Commonly known as: TRUSOPT Apply to eye.   erythromycin ophthalmic ointment Use a small amount on stitches 4 times a day for 14 days   fluticasone 50 MCG/ACT nasal spray Commonly known as: FLONASE Place into both nostrils.   furosemide 40 MG tablet Commonly known as: LASIX Take 40 mg by mouth daily.   Ipratropium-Albuterol 20-100 MCG/ACT Aers respimat Commonly known as: COMBIVENT Inhale 1 puff into the lungs 4 (four) times daily as needed. For wheezing and shortness of breath.   isosorbide mononitrate 30 MG 24 hr tablet Commonly known as: IMDUR Take 30 mg by mouth daily.   latanoprost 0.005 % ophthalmic solution Commonly known as: XALATAN Place 1 drop into both eyes  at bedtime.   meloxicam 15 MG tablet Commonly known as: MOBIC Take 1 tablet by mouth daily.   metoprolol succinate 25 MG 24 hr tablet Commonly known as: TOPROL-XL Take 0.5 tablets by mouth daily.   metoprolol tartrate 50 MG tablet Commonly known as: LOPRESSOR Take 1 tablet (50 mg total) by mouth every morning.   montelukast 10 MG tablet Commonly known as: SINGULAIR Take 1 tablet by mouth daily.   ProAir HFA 108 (90 Base) MCG/ACT inhaler Generic drug: albuterol Inhale 1-2 puffs into the lungs every 6 (six) hours as needed. For wheezing   quinapril 40 MG  tablet Commonly known as: ACCUPRIL Take 40 mg by mouth daily.   ranitidine 300 MG tablet Commonly known as: ZANTAC Take 300 mg by mouth daily.   Spiriva HandiHaler 18 MCG inhalation capsule Generic drug: tiotropium Place 18 mcg into inhaler and inhale daily.   Symbicort 160-4.5 MCG/ACT inhaler Generic drug: budesonide-formoterol Inhale 2 puffs into the lungs 2 (two) times daily.   Vitamin D-1000 Max St 25 MCG (1000 UT) tablet Generic drug: Cholecalciferol Take 1,000 Units by mouth daily.       Allergies:  Allergies  Allergen Reactions  . 2,4-D Dimethylamine (Amisol)     Other reaction(s): Hallucination  . Limonene     Other reaction(s): Hallucination Other reaction(s): Hallucination  . Morphine And Related Anaphylaxis    Family History: No family history on file.  Social History:  reports that he has quit smoking. He has quit using smokeless tobacco. He reports previous alcohol use. He reports that he does not use drugs.   Physical Exam: BP (!) 158/76 (BP Location: Left Arm, Patient Position: Sitting, Cuff Size: Large)   Pulse 67   Ht 5\' 6"  (1.676 m)   Wt 204 lb (92.5 kg)   BMI 32.93 kg/m   Constitutional:  Alert and oriented, No acute distress. HEENT: [place] AT, moist mucus membranes.  Trachea midline, no masses. Cardiovascular: No clubbing, cyanosis, or edema. Respiratory: Normal respiratory effort, no increased work of breathing. GI: Abdomen is soft, nontender, nondistended, no abdominal masses GU: Prostate 35 g, smooth without nodules Skin: No rashes, bruises or suspicious lesions. Neurologic: Grossly intact, no focal deficits, moving all 4 extremities. Psychiatric: Normal mood and affect.  Laboratory Data:  Urinalysis Dipstick/microscopy negative  Assessment & Plan:    75 y.o. male whose twin brother recently diagnosed with prostate cancer  DRE with mild prostate enlargement, no bothersome LUTS  We will check [facility] for any prior PSA  results  UA today clear and if no recent PSA drawn he will return for lab visit with [place], MD  [facility] [address] [place], [place] [address] [phone]

## 2020-12-03 ENCOUNTER — Other Ambulatory Visit: Payer: Self-pay | Admitting: Family Medicine

## 2020-12-23 ENCOUNTER — Other Ambulatory Visit: Payer: Self-pay | Admitting: *Deleted

## 2020-12-23 DIAGNOSIS — N400 Enlarged prostate without lower urinary tract symptoms: Secondary | ICD-10-CM

## 2020-12-23 NOTE — Progress Notes (Signed)
ps

## 2020-12-23 NOTE — Progress Notes (Signed)
   DL           Manuel Murphy Male, 75 y.o., September 26, 1945  MRN:  [id number] Phone:  [phone] Manuel Murphy)        PCP:  Kerri Perches, PA-C Primary Cvg:  Medicare/Medicare Part A And B           Lab visit Received: Rene Kocher, MD  Chrystie Nose, CMA PSA February 2022 was elevated at 8.2. Please schedule follow-up lab visit for repeat PSA        Previous Messages         Media Information        Document Information  Lab:  Lab Result Scan  PSA from [facility]  09/16/2020 00:00  Attached To:  Lab Report - Scanned [[id number]]  Orders Only on 12/03/20 with Donnie Coffin, MD   Source Information  [facility]     Notified patient as instructed, patient pleased. Discussed follow-up appointments, patient agrees

## 2020-12-26 ENCOUNTER — Other Ambulatory Visit: Payer: Medicare Other

## 2020-12-26 ENCOUNTER — Telehealth: Payer: Self-pay | Admitting: Urology

## 2020-12-26 ENCOUNTER — Other Ambulatory Visit: Payer: Self-pay

## 2020-12-26 DIAGNOSIS — N400 Enlarged prostate without lower urinary tract symptoms: Secondary | ICD-10-CM

## 2020-12-26 NOTE — Telephone Encounter (Signed)
ERROR

## 2020-12-27 ENCOUNTER — Telehealth: Payer: Self-pay | Admitting: *Deleted

## 2020-12-27 LAB — PSA: Prostate Specific Ag, Serum: 5.5 ng/mL — ABNORMAL HIGH (ref 0.0–4.0)

## 2020-12-27 NOTE — Telephone Encounter (Signed)
-----   Message from Abbie Sons, MD sent at 12/27/2020  7:22 AM EDT ----- Repeat PSA remains elevated 5.5.  Recommend scheduling prostate biopsy

## 2020-12-27 NOTE — Telephone Encounter (Signed)
Notified patient as instructed, patient pleased. Discussed follow-up appointments, patient understatnds that we need to get Clearance to stop sapirin /

## 2021-01-15 ENCOUNTER — Other Ambulatory Visit: Payer: Self-pay | Admitting: Urology

## 2021-01-27 ENCOUNTER — Ambulatory Visit: Payer: Self-pay | Admitting: Urology

## 2021-02-06 ENCOUNTER — Ambulatory Visit (INDEPENDENT_AMBULATORY_CARE_PROVIDER_SITE_OTHER): Payer: Medicare Other | Admitting: Urology

## 2021-02-06 ENCOUNTER — Other Ambulatory Visit: Payer: Self-pay

## 2021-02-06 ENCOUNTER — Other Ambulatory Visit: Payer: Self-pay | Admitting: Urology

## 2021-02-06 ENCOUNTER — Encounter: Payer: Self-pay | Admitting: Urology

## 2021-02-06 VITALS — BP 137/72 | HR 57 | Ht 68.0 in | Wt 205.0 lb

## 2021-02-06 DIAGNOSIS — R972 Elevated prostate specific antigen [PSA]: Secondary | ICD-10-CM

## 2021-02-06 DIAGNOSIS — N400 Enlarged prostate without lower urinary tract symptoms: Secondary | ICD-10-CM

## 2021-02-06 MED ORDER — LEVOFLOXACIN 500 MG PO TABS
500.0000 mg | ORAL_TABLET | Freq: Once | ORAL | Status: AC
  Administered 2021-02-06: 500 mg via ORAL

## 2021-02-06 MED ORDER — GENTAMICIN SULFATE 40 MG/ML IJ SOLN
80.0000 mg | Freq: Once | INTRAMUSCULAR | Status: AC
  Administered 2021-02-06: 80 mg via INTRAMUSCULAR

## 2021-02-06 NOTE — Progress Notes (Signed)
Prostate Biopsy Procedure   Informed consent was obtained after discussing risks/benefits of the procedure.  A time out was performed to ensure correct patient identity.  Pre-Procedure: - Last PSA Level: 8.2/repeat 5.5.  Twin brother with prostate cancer - Gentamicin given prophylactically - Levaquin 500 mg administered PO -Transrectal Ultrasound performed revealing a 18 gm prostate -No significant hypoechoic or median lobe noted  Procedure: - Prostate block performed using 10 cc 1% lidocaine and biopsies taken from sextant areas, a total of 12 under ultrasound guidance.  Post-Procedure: - Patient tolerated the procedure well - He was counseled to seek immediate medical attention if experiences any severe pain, significant bleeding, or fevers - Return in one week to discuss biopsy results   John Giovanni, MD

## 2021-02-07 LAB — SURGICAL PATHOLOGY

## 2021-02-13 ENCOUNTER — Ambulatory Visit: Payer: Self-pay | Admitting: Urology

## 2021-08-04 ENCOUNTER — Other Ambulatory Visit: Payer: Self-pay

## 2021-08-04 DIAGNOSIS — R972 Elevated prostate specific antigen [PSA]: Secondary | ICD-10-CM

## 2021-08-12 ENCOUNTER — Other Ambulatory Visit: Payer: Medicare Other

## 2021-08-14 ENCOUNTER — Ambulatory Visit: Payer: Medicare Other | Admitting: Urology

## 2021-08-20 ENCOUNTER — Ambulatory Visit: Payer: Medicare Other | Admitting: Urology

## 2021-08-22 ENCOUNTER — Encounter: Payer: Self-pay | Admitting: Urology

## 2022-07-29 ENCOUNTER — Ambulatory Visit: Payer: Medicare Other | Admitting: Urology

## 2022-09-02 ENCOUNTER — Ambulatory Visit: Payer: Medicare Other | Admitting: Urology

## 2022-09-17 ENCOUNTER — Encounter: Payer: Self-pay | Admitting: Urology

## 2022-09-17 ENCOUNTER — Ambulatory Visit (INDEPENDENT_AMBULATORY_CARE_PROVIDER_SITE_OTHER): Payer: Medicare Other | Admitting: Urology

## 2022-09-17 DIAGNOSIS — N401 Enlarged prostate with lower urinary tract symptoms: Secondary | ICD-10-CM

## 2022-09-17 DIAGNOSIS — R972 Elevated prostate specific antigen [PSA]: Secondary | ICD-10-CM | POA: Diagnosis not present

## 2022-09-17 NOTE — Progress Notes (Signed)
09/17/2022 3:17 PM   Cheri Rous 1946/07/12 [id number]  Referring provider: Kerri Perches, PA-C [place],  [place] [address]  Chief Complaint  Patient presents with   Elevated PSA   Urologic history: 1.  Elevated PSA PSA 09/16/2020 was 8.2; repeat 12/26/2020 5.5 Twin brother with prostate cancer Biopsy 02/06/2021 with 18 g prostate; 12/12 cores benign  HPI: 77 y.o. male presents for follow-up visit.  States he has not had his PSA checked postbiopsy and PCP recommended urology follow-up No bothersome LUTS; on tamsulosin Denies dysuria, gross hematuria No flank, abdominal or pelvic pain   PMH: Past Medical History:  Diagnosis Date   CHF (congestive heart failure) (HCC)    Chronic respiratory failure (HCC)    COPD (chronic obstructive pulmonary disease) (HCC)    GERD (gastroesophageal reflux disease)    Glaucoma    Hypertension     Surgical History: Past Surgical History:  Procedure Laterality Date   COLONOSCOPY WITH PROPOFOL N/A 05/30/2019   Procedure: COLONOSCOPY WITH PROPOFOL;  Surgeon: Lucilla Lame, MD;  Location: [facility] ENDOSCOPY;  Service: Endoscopy;  Laterality: N/A;   JOINT REPLACEMENT     TOTAL KNEE ARTHROPLASTY      Home Medications:  Allergies as of 09/17/2022       Reactions   2,4-d Dimethylamine    Other reaction(s): Hallucination   Limonene    Other reaction(s): Hallucination Other reaction(s): Hallucination   Morphine And Related Anaphylaxis        Medication List        Accurate as of September 17, 2022  3:17 PM. If you have any questions, ask your nurse or doctor.          STOP taking these medications    Cholecalciferol 25 MCG (1000 UT) tablet Stopped by: Abbie Sons, MD   erythromycin ophthalmic ointment Stopped by: Abbie Sons, MD   Ipratropium-Albuterol 20-100 MCG/ACT Aers respimat Commonly known as: COMBIVENT Stopped by: Abbie Sons, MD   isosorbide mononitrate 30 MG 24 hr  tablet Commonly known as: IMDUR Stopped by: Abbie Sons, MD   metoprolol tartrate 50 MG tablet Commonly known as: LOPRESSOR Stopped by: Abbie Sons, MD   quinapril 40 MG tablet Commonly known as: ACCUPRIL Stopped by: Abbie Sons, MD   ranitidine 300 MG tablet Commonly known as: ZANTAC Stopped by: Abbie Sons, MD       TAKE these medications    albuterol 108 (90 Base) MCG/ACT inhaler Commonly known as: VENTOLIN HFA Inhale 1-2 puffs into the lungs every 6 (six) hours as needed. For wheezing   Alphagan P 0.15 % ophthalmic solution Generic drug: brimonidine Place 1 drop into both eyes daily.   amLODipine 10 MG tablet Commonly known as: NORVASC Take 1 tablet by mouth daily.   aspirin EC 81 MG tablet Take 81 mg by mouth daily.   atorvastatin 40 MG tablet Commonly known as: LIPITOR Take 40 mg by mouth at bedtime.   brimonidine-timolol 0.2-0.5 % ophthalmic solution Commonly known as: COMBIGAN Apply 1 drop to eye 2 (two) times daily.   cetirizine 10 MG tablet Commonly known as: ZYRTEC Take by mouth.   Cialis 20 MG tablet Generic drug: tadalafil Take 1 tablet by mouth once a day as needed as needed prior to need   dorzolamide 2 % ophthalmic solution Commonly known as: TRUSOPT Apply to eye.   fluticasone 50 MCG/ACT nasal spray Commonly known as: FLONASE Place into both nostrils.   furosemide  40 MG tablet Commonly known as: LASIX Take 40 mg by mouth daily.   latanoprost 0.005 % ophthalmic solution Commonly known as: XALATAN Place 1 drop into both eyes at bedtime.   meloxicam 15 MG tablet Commonly known as: MOBIC Take 1 tablet by mouth daily.   metoprolol succinate 25 MG 24 hr tablet Commonly known as: TOPROL-XL Take 0.5 tablets by mouth daily.   montelukast 10 MG tablet Commonly known as: SINGULAIR Take 1 tablet by mouth daily.   Spiriva HandiHaler 18 MCG inhalation capsule Generic drug: tiotropium Place 18 mcg into inhaler and  inhale daily.   Symbicort 160-4.5 MCG/ACT inhaler Generic drug: budesonide-formoterol Inhale 2 puffs into the lungs 2 (two) times daily.   tamsulosin 0.4 MG Caps capsule Commonly known as: FLOMAX Take by mouth daily.        Allergies:  Allergies  Allergen Reactions   2,4-D Dimethylamine     Other reaction(s): Hallucination   Limonene     Other reaction(s): Hallucination Other reaction(s): Hallucination   Morphine And Related Anaphylaxis    Family History: History reviewed. No pertinent family history.  Social History:  reports that he has quit smoking. He has quit using smokeless tobacco. He reports that he does not currently use alcohol. He reports that he does not use drugs.   Physical Exam: There were no vitals taken for this visit.  Constitutional:  Alert and oriented, No acute distress. HEENT: [place] AT, moist mucus membranes.  Trachea midline, no masses. Cardiovascular: No clubbing, cyanosis, or edema. Respiratory: Normal respiratory effort, no increased work of breathing. GU: Prostate grams, smooth without nodules Skin: No rashes, bruises or suspicious lesions. Neurologic: Grossly intact, no focal deficits, moving all 4 extremities. Psychiatric: Normal mood and affect.   Assessment & Plan:    1. Elevated PSA PSA drawn today If stable follow-up 1 year with PSA If elevated above baseline schedule prostate MRI   Abbie Sons, MD  [place] [place] [address], [place], [place] [address] [phone]

## 2022-09-18 ENCOUNTER — Other Ambulatory Visit: Payer: Self-pay | Admitting: Urology

## 2022-09-18 ENCOUNTER — Telehealth: Payer: Self-pay | Admitting: Family Medicine

## 2022-09-18 ENCOUNTER — Encounter: Payer: Self-pay | Admitting: Family Medicine

## 2022-09-18 DIAGNOSIS — R972 Elevated prostate specific antigen [PSA]: Secondary | ICD-10-CM

## 2022-09-18 LAB — PSA: Prostate Specific Ag, Serum: 10 ng/mL — ABNORMAL HIGH (ref 0.0–4.0)

## 2022-09-18 NOTE — Telephone Encounter (Signed)
Patient notified and voiced understanding, Prostate MRI instruction sent to [facility].

## 2022-09-18 NOTE — Telephone Encounter (Signed)
-----   Message from Abbie Sons, MD sent at 09/18/2022  6:58 AM EST ----- PSA has increased to 10.0.  We discussed at yesterday's office visit obtaining a prostate MRI if PSA was increased.  MRI order was placed and will call with results

## 2022-10-12 ENCOUNTER — Ambulatory Visit
Admission: RE | Admit: 2022-10-12 | Discharge: 2022-10-12 | Disposition: A | Discharge status: Home / Self Care | Payer: Medicare Other | Source: Ambulatory Visit | Attending: Urology | Admitting: Urology

## 2022-10-12 DIAGNOSIS — R972 Elevated prostate specific antigen [PSA]: Secondary | ICD-10-CM | POA: Insufficient documentation

## 2022-10-12 MED ORDER — GADOBUTROL 1 MMOL/ML IV SOLN
9.0000 mL | Freq: Once | INTRAVENOUS | Status: AC | PRN
  Administered 2022-10-12: 9 mL via INTRAVENOUS

## 2022-10-13 ENCOUNTER — Encounter: Payer: Self-pay | Admitting: Urology

## 2022-10-21 ENCOUNTER — Telehealth: Payer: Self-pay

## 2022-10-21 NOTE — Telephone Encounter (Signed)
Manuel Murphy 10/21/2022    Provider Requested: [facility] MRI date and location: 10/12/22 [facility]   Date of fusion bx: 12/02/22 Time: 130pm  Blood Thinners- None ASA- Yes- Aware to stop 7 days before procedure.  Nsaids- Stop 2 days prior. Pt aware.  Clearance needed- Yes. Clearance sent to [facility].    Fleet's Enema OTC 2 hours prior - Pt aware.    Follow up appt: 12/18/22 815am with [facility] Instructions sent via my chart or mailed. CM

## 2022-11-11 ENCOUNTER — Other Ambulatory Visit: Payer: Medicare Other | Admitting: Urology

## 2022-12-02 ENCOUNTER — Encounter: Payer: Self-pay | Admitting: Urology

## 2022-12-02 ENCOUNTER — Ambulatory Visit (INDEPENDENT_AMBULATORY_CARE_PROVIDER_SITE_OTHER): Payer: Medicare Other | Admitting: Urology

## 2022-12-02 VITALS — BP 143/70 | HR 61 | Ht 64.0 in | Wt 200.0 lb

## 2022-12-02 DIAGNOSIS — R972 Elevated prostate specific antigen [PSA]: Secondary | ICD-10-CM | POA: Diagnosis not present

## 2022-12-02 DIAGNOSIS — Z2989 Encounter for other specified prophylactic measures: Secondary | ICD-10-CM | POA: Diagnosis not present

## 2022-12-02 MED ORDER — GENTAMICIN SULFATE 40 MG/ML IJ SOLN
80.0000 mg | Freq: Once | INTRAMUSCULAR | Status: AC
  Administered 2022-12-02: 80 mg via INTRAMUSCULAR

## 2022-12-02 MED ORDER — LEVOFLOXACIN 500 MG PO TABS
500.0000 mg | ORAL_TABLET | Freq: Once | ORAL | Status: AC
  Administered 2022-12-02: 500 mg via ORAL

## 2022-12-02 NOTE — Patient Instructions (Signed)

## 2022-12-02 NOTE — Progress Notes (Signed)
   12/02/22  Indication: Elevated PSA of 10, prostate measured 19 g on prostate MRI, PI-RADS 4 lesion at the right anterior transition zone, history of negative prostate biopsy in July 2022  MRI Fusion Prostate Biopsy Procedure   Informed consent was obtained, and we discussed the risks of bleeding and infection/sepsis. A time out was performed to ensure correct patient identity.  Pre-Procedure: - Last PSA Level: 10 - Gentamicin and levaquin given for antibiotic prophylaxis -Prostate measured 19 g on MRI, PSA density 0.53 - No significant hypoechoic or median lobe noted  Procedure: - Prostate block performed using 10 cc 1% lidocaine  - MRI fusion biopsy was performed, and 3 biopsies were taken from the ROI PIRADS 4 lesion located at the right anterior transition zone - Standard biopsies taken from sextant areas, 12 under ultrasound guidance. - Total of 15 cores taken  Post-Procedure: - Patient tolerated the procedure well - He was counseled to seek immediate medical attention if experiences significant bleeding, fevers, or severe pain - Return in one week to discuss biopsy results  Assessment/ Plan: Will follow up in 1-2 weeks to discuss pathology with Dr. Mort Sawyers, MD 12/02/2022

## 2022-12-18 ENCOUNTER — Encounter: Payer: Self-pay | Admitting: Urology

## 2022-12-18 ENCOUNTER — Ambulatory Visit: Payer: Medicare Other | Admitting: Urology

## 2022-12-18 VITALS — BP 144/77 | HR 65 | Ht 66.0 in | Wt 200.0 lb

## 2022-12-18 DIAGNOSIS — N401 Enlarged prostate with lower urinary tract symptoms: Secondary | ICD-10-CM

## 2022-12-18 DIAGNOSIS — R972 Elevated prostate specific antigen [PSA]: Secondary | ICD-10-CM

## 2022-12-18 NOTE — Progress Notes (Signed)
I, Manuel Murphy,acting as a scribe for Manuel Altes, MD.,have documented all relevant documentation on the behalf of Manuel Altes, MD,as directed by  Manuel Altes, MD while in the presence of Manuel Altes, MD.  12/18/2022 9:06 AM   Manuel Murphy 11-04-1945 [id number]  Referring provider: Marya Fossa, PA-C [address] [place],  [place] [address]  Chief Complaint  Patient presents with   Elevated PSA   Urologic history: 1.  Elevated PSA PSA 09/16/2020 was 8.2; repeat 12/26/2020 5.5; 09/17/22 10.0 Twin brother with prostate cancer Biopsy 02/06/2021 with 18 g prostate; 12/12 cores benign  HPI: Manuel Murphy is a 77 y.o. male presents for prostate biopsy follow-up.  MR fusion biopsy performed by Dr. Richardo Hanks 12/02/2022 for an elevated PSA of 10 with a PI-RADS 4 lesion right anterior transition zone. History of negative prostate biopsy July 2022. He underwent a standard 12 core template biopsy and 3 ROI biopsies were obtained.  He had no post biopsy complaints. Pathology ROI biopsies showed benign prostate tissue. Template biopsies were negative. The right lateral apical biopsy had insufficient tissue for diagnosis. The left lateral apical biopsy showed benign connective tissue and smooth muscle.  PSA trend  Prostate Specific Ag, Serum  Latest Ref Rng 0.0 - 4.0 ng/mL  12/26/2020 5.5 (H)   09/17/2022 10.0 (H)      PMH: Past Medical History:  Diagnosis Date   CHF (congestive heart failure) (HCC)    Chronic respiratory failure (HCC)    COPD (chronic obstructive pulmonary disease) (HCC)    GERD (gastroesophageal reflux disease)    Glaucoma    Hypertension     Surgical History: Past Surgical History:  Procedure Laterality Date   COLONOSCOPY WITH PROPOFOL N/A 05/30/2019   Procedure: COLONOSCOPY WITH PROPOFOL;  Surgeon: Midge Minium, MD;  Location: [facility] ENDOSCOPY;  Service: Endoscopy;  Laterality: N/A;   JOINT REPLACEMENT     TOTAL KNEE ARTHROPLASTY       Home Medications:  Allergies as of 12/18/2022       Reactions   2,4-d Dimethylamine    Other reaction(s): Hallucination   Limonene    Other reaction(s): Hallucination Other reaction(s): Hallucination   Morphine And Codeine Anaphylaxis        Medication List        Accurate as of Dec 18, 2022  9:06 AM. If you have any questions, ask your nurse or doctor.          albuterol 108 (90 Base) MCG/ACT inhaler Commonly known as: VENTOLIN HFA Inhale 1-2 puffs into the lungs every 6 (six) hours as needed. For wheezing   Alphagan P 0.15 % ophthalmic solution Generic drug: brimonidine Place 1 drop into both eyes daily.   amLODipine 10 MG tablet Commonly known as: NORVASC Take 1 tablet by mouth daily.   aspirin EC 81 MG tablet Take 81 mg by mouth daily.   brimonidine-timolol 0.2-0.5 % ophthalmic solution Commonly known as: COMBIGAN Apply 1 drop to eye 2 (two) times daily.   dorzolamide 2 % ophthalmic solution Commonly known as: TRUSOPT Apply to eye.   fluticasone 50 MCG/ACT nasal spray Commonly known as: FLONASE Place into both nostrils.   furosemide 40 MG tablet Commonly known as: LASIX Take 40 mg by mouth daily.   latanoprost 0.005 % ophthalmic solution Commonly known as: XALATAN Place 1 drop into both eyes at bedtime.   meloxicam 15 MG tablet Commonly known as: MOBIC Take 1 tablet by mouth daily.  metoprolol succinate 25 MG 24 hr tablet Commonly known as: TOPROL-XL Take 0.5 tablets by mouth daily.   montelukast 10 MG tablet Commonly known as: SINGULAIR Take 1 tablet by mouth daily.   Spiriva HandiHaler 18 MCG inhalation capsule Generic drug: tiotropium Place 18 mcg into inhaler and inhale daily.   Symbicort 160-4.5 MCG/ACT inhaler Generic drug: budesonide-formoterol Inhale 2 puffs into the lungs 2 (two) times daily.   tamsulosin 0.4 MG Caps capsule Commonly known as: FLOMAX Take by mouth daily.        Allergies:  Allergies  Allergen  Reactions   2,4-D Dimethylamine     Other reaction(s): Hallucination   Limonene     Other reaction(s): Hallucination Other reaction(s): Hallucination   Morphine And Codeine Anaphylaxis    Social History:  reports that he has quit smoking. He has been exposed to tobacco smoke. He has quit using smokeless tobacco. He reports that he does not currently use alcohol. He reports that he does not use drugs.   Physical Exam: BP (!) 144/77   Pulse 65   Ht 5\' 6"  (1.676 m)   Wt 200 lb (90.7 kg)   BMI 32.28 kg/m   Constitutional:  Alert and oriented, No acute distress. HEENT: [place] AT Respiratory: Normal respiratory effort, no increased work of breathing. Psychiatric: Normal mood and affect.   Assessment & Plan:    1. Elevated PSA MR fusion biopsy showed no evidence of prostate cancer. 6 month follow up with PSA.  2. BPH with LUTS He inquired if he could discontinue the Tamsulosin. He has mild urgency however he thinks this is secondary to his diuretic. We discussed he could try stopping the Tamsulosin, however if his voiding symptoms worsened over the ensuing 1-2 weeks he is to restart.  I have reviewed the above documentation for accuracy and completeness, and I agree with the above.   Manuel Altes, MD  [facility] [address] [place], [place] [address] [phone]

## 2023-06-21 ENCOUNTER — Other Ambulatory Visit: Payer: Medicare Other

## 2023-06-23 ENCOUNTER — Ambulatory Visit: Payer: Medicare Other | Admitting: Urology

## 2024-01-10 ENCOUNTER — Emergency Department
Admission: EM | Admit: 2024-01-10 | Discharge: 2024-01-10 | Disposition: A | Discharge status: Home / Self Care | Attending: Emergency Medicine | Admitting: Emergency Medicine

## 2024-01-10 ENCOUNTER — Emergency Department

## 2024-01-10 ENCOUNTER — Other Ambulatory Visit: Payer: Self-pay

## 2024-01-10 DIAGNOSIS — I890 Lymphedema, not elsewhere classified: Secondary | ICD-10-CM | POA: Insufficient documentation

## 2024-01-10 DIAGNOSIS — J449 Chronic obstructive pulmonary disease, unspecified: Secondary | ICD-10-CM | POA: Diagnosis not present

## 2024-01-10 DIAGNOSIS — M7989 Other specified soft tissue disorders: Secondary | ICD-10-CM | POA: Diagnosis present

## 2024-01-10 DIAGNOSIS — Z9981 Dependence on supplemental oxygen: Secondary | ICD-10-CM | POA: Insufficient documentation

## 2024-01-10 DIAGNOSIS — G4733 Obstructive sleep apnea (adult) (pediatric): Secondary | ICD-10-CM | POA: Diagnosis not present

## 2024-01-10 DIAGNOSIS — E669 Obesity, unspecified: Secondary | ICD-10-CM | POA: Diagnosis not present

## 2024-01-10 LAB — CBC
HCT: 40.9 % (ref 39.0–52.0)
Hemoglobin: 12.9 g/dL — ABNORMAL LOW (ref 13.0–17.0)
MCH: 27.3 pg (ref 26.0–34.0)
MCHC: 31.5 g/dL (ref 30.0–36.0)
MCV: 86.7 fL (ref 80.0–100.0)
Platelets: 222 10*3/uL (ref 150–400)
RBC: 4.72 MIL/uL (ref 4.22–5.81)
RDW: 14.2 % (ref 11.5–15.5)
WBC: 4.7 10*3/uL (ref 4.0–10.5)
nRBC: 0 % (ref 0.0–0.2)

## 2024-01-10 LAB — BASIC METABOLIC PANEL WITH GFR
Anion gap: 5 (ref 5–15)
BUN: 29 mg/dL — ABNORMAL HIGH (ref 8–23)
CO2: 27 mmol/L (ref 22–32)
Calcium: 9 mg/dL (ref 8.9–10.3)
Chloride: 107 mmol/L (ref 98–111)
Creatinine, Ser: 0.92 mg/dL (ref 0.61–1.24)
GFR, Estimated: >60 mL/min (ref >60)
Glucose, Bld: 94 mg/dL (ref 70–99)
Potassium: 3.9 mmol/L (ref 3.5–5.1)
Sodium: 139 mmol/L (ref 135–145)

## 2024-01-10 LAB — BRAIN NATRIURETIC PEPTIDE: B Natriuretic Peptide: 14.9 pg/mL (ref 0.0–100.0)

## 2024-01-10 NOTE — ED Triage Notes (Addendum)
 Pt comes with c/o right leg and knee swelling for over a week. Pt states some pain and warmth to knee area. Pt states his pcp sent him over here also. Pt may have had some blood test that was abnormal.  Pt has hx of chf and is on thinner

## 2024-01-10 NOTE — ED Provider Notes (Signed)
 [facility] Provider Note    Event Date/Time   First MD Initiated Contact with Patient 01/10/24 1607     (approximate)   History   Leg Swelling   HPI  Manuel Murphy is a 78 y.o. male who presents to the ED for evaluation of Leg Swelling   Review of pulmonary clinic visit from April.  Obese patient with history of OSA, GERD, restrictive PFTs/COPD.  EF 45%, possible pulmonary hypertension  Patient presents for evaluation to make sure there is not a clot in my leg.  He reports a few weeks of progressive right leg swelling.  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  No shortness of breath, orthopnea.  He states on 2 L at home has not had to increase it, in fact has not been using his oxygen as much at home because he has been feeling better in this respect.  No fevers, trauma or falls, weakness or sensation changes to the right leg  Physical Exam   Triage Vital Signs: ED Triage Vitals  Encounter Vitals Group     BP 01/10/24 1216 (!) 136/90     Girls Systolic BP Percentile --      Girls Diastolic BP Percentile --      Boys Systolic BP Percentile --      Boys Diastolic BP Percentile --      Pulse Rate 01/10/24 1216 60     Resp 01/10/24 1216 18     Temp 01/10/24 1216 98 F (36.7 C)     Temp src --      SpO2 01/10/24 1216 92 %     Weight 01/10/24 1214 240 lb (108.9 kg)     Height 01/10/24 1214 5' 6 (1.676 m)     Head Circumference --      Peak Flow --      Pain Score 01/10/24 1214 3     Pain Loc --      Pain Education --      Exclude from Growth Chart --     Most recent vital signs: Vitals:   01/10/24 1216  BP: (!) 136/90  Pulse: 60  Resp: 18  Temp: 98 F (36.7 C)  SpO2: 92%    General: Awake, no distress.  Pleasant and conversational, well-appearing.  On his chronic 2 L of supplemental oxygen CV:  Good peripheral perfusion.  Resp:  Normal  effort.  Abd:  No distention.  MSK:  No deformity noted.  Neuro:  No focal deficits appreciated. Other:  Compression stockings on bilateral legs, take these down .  Asymmetric lower extremity swelling R > L.  No overlying skin changes such as erythema, bruising or pallor.  Warm and well-perfused with palpable and symmetric DP pulses.  Able to passively and actively range, some mild right knee pain with ranging.  No signs of trauma   ED Results / Procedures / Treatments   Labs (all labs ordered are listed, but only abnormal results are displayed) Labs Reviewed  CBC - Abnormal; Notable for the following components:      Result Value   Hemoglobin 12.9 (*)    All other components within normal limits  BASIC METABOLIC PANEL WITH GFR - Abnormal; Notable for the following components:   BUN 29 (*)    All other components within normal limits  BRAIN NATRIURETIC PEPTIDE    EKG   RADIOLOGY  Venous ultrasound of the right leg interpreted by me without signs of DVT  Official radiology report(s): US  Venous Img Lower Unilateral Right Result Date: 01/10/2024 CLINICAL DATA:  Right leg and knee swelling for 1 week EXAM: Right LOWER EXTREMITY VENOUS DOPPLER ULTRASOUND TECHNIQUE: Gray-scale sonography with compression, as well as color and duplex ultrasound, were performed to evaluate the deep venous system(s) from the level of the common femoral vein through the popliteal and proximal calf veins. COMPARISON:  None FINDINGS: VENOUS Normal compressibility of the common femoral, superficial femoral, and popliteal veins, as well as the visualized calf veins. Visualized portions of profunda femoral vein and great saphenous vein unremarkable. No filling defects to suggest DVT on grayscale or color Doppler imaging. Doppler waveforms show normal direction of venous flow, normal respiratory plasticity and response to augmentation. Limited views of the contralateral common femoral vein are unremarkable. OTHER None.  Limitations: none IMPRESSION: Negative for DVT in the right lower extremity Electronically Signed   By: Cordella Banner   On: 01/10/2024 14:41    PROCEDURES and INTERVENTIONS:  Procedures  Medications - No data to display   IMPRESSION / MDM / ASSESSMENT AND PLAN / ED COURSE  I reviewed the triage vital signs and the nursing notes.  Differential diagnosis includes, but is not limited to, DVT, lymphedema, cellulitis, arterial ischemia, trauma or fall, CHF  {Patient presents with symptoms of an acute illness or injury that is potentially life-threatening.  Patient presents with progressive subacute right leg swelling with a benign workup and suitable for outpatient management with conservative measures and PCP follow-up.  No symptoms of CHF, low BNP.  He has a normal metabolic panel and CBC.  Ultrasound without DVT.  Reassuring exam.  Suitable for outpatient management.      FINAL CLINICAL IMPRESSION(S) / ED DIAGNOSES   Final diagnoses:  Right leg swelling  Lymphedema     Rx / DC Orders   ED Discharge Orders     None        Note:  This document was prepared using Dragon voice recognition software and may include unintentional dictation errors.   Claudene Rover, MD 01/10/24 [phone]

## 2024-01-10 NOTE — Discharge Instructions (Signed)
 No signs of blood clot today

## 2024-04-22 ENCOUNTER — Other Ambulatory Visit: Payer: Self-pay

## 2024-04-22 ENCOUNTER — Emergency Department
Admission: EM | Admit: 2024-04-22 | Discharge: 2024-04-22 | Disposition: A | Discharge status: Home / Self Care | Attending: Emergency Medicine | Admitting: Emergency Medicine

## 2024-04-22 DIAGNOSIS — W228XXA Striking against or struck by other objects, initial encounter: Secondary | ICD-10-CM | POA: Insufficient documentation

## 2024-04-22 DIAGNOSIS — J449 Chronic obstructive pulmonary disease, unspecified: Secondary | ICD-10-CM | POA: Insufficient documentation

## 2024-04-22 DIAGNOSIS — S01511A Laceration without foreign body of lip, initial encounter: Secondary | ICD-10-CM | POA: Diagnosis present

## 2024-04-22 MED ORDER — CHLORHEXIDINE GLUCONATE 0.12 % MT SOLN: 15.0000 mL | Freq: Two times a day (BID) | OROMUCOSAL | 0 refills | Status: AC

## 2024-04-22 NOTE — ED Provider Notes (Signed)
 [facility] Provider Note    Event Date/Time   First MD Initiated Contact with Patient 04/22/24 1449     (approximate)   History   Lip Laceration   HPI  Manuel Murphy is a 78 y.o. male who presents today for evaluation of laceration to his lower lip that he sustained several days ago.  He reports that he only came in due to his family's encouragement.  He reports that he was cutting a bush and one of the branches hit him in the face and cut his lip.  He has not had any problems with this, though does report that he has some discomfort when biting into things like sandwiches.  No more bleeding.  He denies malocclusion.  No intraoral problems.  He denies having fallen to the ground.  He has not had any headache or neck pain.  No nausea or vomiting.  He is not anticoagulated.  Patient Active Problem List   Diagnosis Date Noted   History of colonic polyps    Rectal polyp    COPD (chronic obstructive pulmonary disease) (HCC) 03/23/2015          Physical Exam   Triage Vital Signs: ED Triage Vitals  Encounter Vitals Group     BP 04/22/24 1420 (!) 175/98     Girls Systolic BP Percentile --      Girls Diastolic BP Percentile --      Boys Systolic BP Percentile --      Boys Diastolic BP Percentile --      Pulse Rate 04/22/24 1420 76     Resp 04/22/24 1420 17     Temp 04/22/24 1420 98.8 F (37.1 C)     Temp Source 04/22/24 1420 Oral     SpO2 04/22/24 1420 93 %     Weight 04/22/24 1421 194 lb (88 kg)     Height 04/22/24 1421 5' 6 (1.676 m)     Head Circumference --      Peak Flow --      Pain Score 04/22/24 1421 0     Pain Loc --      Pain Education --      Exclude from Growth Chart --     Most recent vital signs: Vitals:   04/22/24 1420 04/22/24 1513  BP: (!) 175/98   Pulse: 76   Resp: 17   Temp: 98.8 F (37.1 C)   SpO2: 93% 93%    Physical Exam Vitals and nursing note reviewed.  Constitutional:      General: Awake and alert. No acute  distress.    Appearance: Normal appearance. The patient is normal weight.  HENT:     Head: Normocephalic and atraumatic.     Mouth: Mucous membranes are moist.  Patient has a 1.5 cm laceration to his lower lip, vertical.  Does not cross the vermilion border.  No intraoral abnormalities.  No dental loosening. Eyes:     General: PERRL. Normal EOMs        Right eye: No discharge.        Left eye: No discharge.     Conjunctiva/sclera: Conjunctivae normal.  Cardiovascular:     Rate and Rhythm: Normal rate and regular rhythm.     Pulses: Normal pulses.  Pulmonary:     Effort: Pulmonary effort is normal. No respiratory distress.     Breath sounds: Normal breath sounds.  Abdominal:     Abdomen is soft. There is no abdominal tenderness. No  rebound or guarding. No distention. Musculoskeletal:        General: No swelling. Normal range of motion.     Cervical back: Normal range of motion and neck supple.  Skin:    General: Skin is warm and dry.     Capillary Refill: Capillary refill takes less than 2 seconds.     Findings: No rash.  Neurological:     Mental Status: The patient is awake and alert.   Neurological: GCS 15 alert and oriented x3 Normal speech, no expressive or receptive aphasia or dysarthria Cranial nerves II through XII intact Normal visual fields 5 out of 5 strength in all 4 extremities with intact sensation throughout No extremity drift Normal finger-to-nose testing, no limb or truncal ataxia    ED Results / Procedures / Treatments   Labs (all labs ordered are listed, but only abnormal results are displayed) Labs Reviewed - No data to display   EKG     RADIOLOGY     PROCEDURES:  Critical Care performed:   Procedures   MEDICATIONS ORDERED IN ED: Medications - No data to display   IMPRESSION / MDM / ASSESSMENT AND PLAN / ED COURSE  I reviewed the triage vital signs and the nursing notes.   Differential diagnosis includes, but is not limited to,  lip laceration, contusion, hematoma  Patient is awake and alert, hemodynamically stable and afebrile.  He does have a laceration to his lip, though this is old, and closure will likely result in infection.  Patient is in agreement with this.  He was given Peridex to help keep the area clean, and advised not to bite into things.  He does not have any dental loosening or malocclusion.  He has not had any headache, facial pain, neck pain, and did not fall to the ground when this happened.  He has not had any vomiting or visual changes and is not anticoagulated.  No indication for CT imaging at this time, and patient is in agreement.  He reports that his tetanus is up-to-date, last received last year.  We discussed wound care and return precautions.  Patient understands and agrees with plan.  He was discharged in stable condition  Patient's presentation is most consistent with acute complicated illness / injury requiring diagnostic workup.     FINAL CLINICAL IMPRESSION(S) / ED DIAGNOSES   Final diagnoses:  Lip laceration, initial encounter     Rx / DC Orders   ED Discharge Orders          Ordered    chlorhexidine (PERIDEX) 0.12 % solution  2 times daily        04/22/24 1507             Note:  This document was prepared using Dragon voice recognition software and may include unintentional dictation errors.   Devita Nies E, PA-C 04/22/24 1813    Arlander Charleston, MD 04/23/24 1252

## 2024-04-22 NOTE — ED Triage Notes (Signed)
 Pt c/o a split lip. States he was working in the yard a few days ago and piece of brush hit him. PT denies any pain.

## 2024-04-22 NOTE — Discharge Instructions (Addendum)
 Please use the mouthwash to keep everything clean.  Please return for any new, worsening, or change in symptoms or other concerns.  It was a pleasure caring for you today.

## 2024-06-12 ENCOUNTER — Other Ambulatory Visit: Payer: Self-pay

## 2024-06-12 ENCOUNTER — Emergency Department
Admission: EM | Admit: 2024-06-12 | Discharge: 2024-06-12 | Disposition: A | Discharge status: Home / Self Care | Attending: Emergency Medicine | Admitting: Emergency Medicine

## 2024-06-12 ENCOUNTER — Encounter: Payer: Self-pay | Admitting: Emergency Medicine

## 2024-06-12 DIAGNOSIS — J449 Chronic obstructive pulmonary disease, unspecified: Secondary | ICD-10-CM | POA: Diagnosis not present

## 2024-06-12 DIAGNOSIS — T161XXA Foreign body in right ear, initial encounter: Secondary | ICD-10-CM | POA: Insufficient documentation

## 2024-06-12 DIAGNOSIS — I110 Hypertensive heart disease with heart failure: Secondary | ICD-10-CM | POA: Insufficient documentation

## 2024-06-12 DIAGNOSIS — I509 Heart failure, unspecified: Secondary | ICD-10-CM | POA: Insufficient documentation

## 2024-06-12 DIAGNOSIS — W448XXA Other foreign body entering into or through a natural orifice, initial encounter: Secondary | ICD-10-CM | POA: Diagnosis not present

## 2024-06-12 NOTE — ED Provider Notes (Signed)
 [facility] Provider Note    Event Date/Time   First MD Initiated Contact with Patient 06/12/24 2144     (approximate)   History   Foreign Body in Ear   HPI  Manuel Murphy is a 78 y.o. male with PMH of COPD, CHF, HTN, GERD and chronic respiratory failure presents for evaluation of foreign body in the right ear.  Patient was cleaning his ears tonight when the end of the Q-tip got stuck in it.  Decreased hearing on that side but no pain.      Physical Exam   Triage Vital Signs: ED Triage Vitals [06/12/24 2022]  Encounter Vitals Group     BP (!) 154/84     Girls Systolic BP Percentile      Girls Diastolic BP Percentile      Boys Systolic BP Percentile      Boys Diastolic BP Percentile      Pulse Rate 71     Resp 16     Temp 98.3 F (36.8 C)     Temp Source Oral     SpO2 100 %     Weight      Height      Head Circumference      Peak Flow      Pain Score 6     Pain Loc      Pain Education      Exclude from Growth Chart     Most recent vital signs: Vitals:   06/12/24 2022  BP: (!) 154/84  Pulse: 71  Resp: 16  Temp: 98.3 F (36.8 C)  SpO2: 100%    General: Awake, no distress.  CV:  Good peripheral perfusion.  Resp:  Normal effort.  Abd:  No distention.  Other:  Q-tip deep within the ear canal   ED Results / Procedures / Treatments   Labs (all labs ordered are listed, but only abnormal results are displayed) Labs Reviewed - No data to display   PROCEDURES:  Critical Care performed: No  .Foreign Body Removal  Date/Time: 06/12/2024 11:13 PM  Performed by: Cleaster Tinnie LABOR, PA-C Authorized by: Cleaster Tinnie LABOR, PA-C  Consent: Verbal consent obtained Risks and benefits: risks, benefits and alternatives were discussed Consent given by: patient Patient identity confirmed: verbally with patient Body area: ear Location details: right ear  Sedation: Patient sedated: no  Patient restrained: no Patient cooperative:  yes Localization method: visualized Removal mechanism: suction and forceps Complexity: simple 1 objects recovered. Objects recovered: tip of cotton swab Post-procedure assessment: foreign body removed Patient tolerance: patient tolerated the procedure well with no immediate complications     MEDICATIONS ORDERED IN ED: Medications - No data to display   IMPRESSION / MDM / ASSESSMENT AND PLAN / ED COURSE  I reviewed the triage vital signs and the nursing notes.                             78 year old male presents for evaluation of foreign body in his right ear.  Blood pressure is little elevated but patient has history of hypertension, vital signs stable otherwise.  Differential diagnosis includes, but is not limited to, foreign body, tympanic membrane rupture, otitis externa.  Patient's presentation is most consistent with acute, uncomplicated illness.  Patient has a Q-tip in his right ear canal.  This was removed using combination of Frazier suction and the needle forceps.  Patient's eardrum was intact after the  procedure.  Patient reported improved hearing.  No trauma or abrasions noted to the ear canal.  Patient voiced understanding, all questions were answered he was stable at discharge.      FINAL CLINICAL IMPRESSION(S) / ED DIAGNOSES   Final diagnoses:  Foreign body of right ear, initial encounter     Rx / DC Orders   ED Discharge Orders     None        Note:  This document was prepared using Dragon voice recognition software and may include unintentional dictation errors.   Cleaster Tinnie LABOR, PA-C 06/12/24 2314    Arlander Charleston, MD 06/13/24 1047

## 2024-06-12 NOTE — Discharge Instructions (Signed)
 The q-tip was removed from your ear today. Return to the ED with any worsening symptoms.

## 2024-06-12 NOTE — ED Triage Notes (Signed)
 Pt arrives POV ambulatory to triage, gait steady, no acute distress noted c/o getting the end of q-tip stuck in his right ear tonight.

## 2024-08-16 ENCOUNTER — Encounter: Payer: Self-pay | Admitting: Urology

## 2024-08-16 ENCOUNTER — Ambulatory Visit (INDEPENDENT_AMBULATORY_CARE_PROVIDER_SITE_OTHER): Admitting: Urology

## 2024-08-16 VITALS — BP 176/94 | HR 62 | Ht 66.0 in | Wt 193.0 lb

## 2024-08-16 DIAGNOSIS — N5201 Erectile dysfunction due to arterial insufficiency: Secondary | ICD-10-CM

## 2024-08-16 DIAGNOSIS — R972 Elevated prostate specific antigen [PSA]: Secondary | ICD-10-CM

## 2024-08-16 MED ORDER — TADALAFIL 5 MG PO TABS: 5.0000 mg | ORAL_TABLET | Freq: Every day | ORAL | 3 refills | Status: AC | PRN

## 2024-08-16 MED ORDER — TADALAFIL 10 MG PO TABS: ORAL_TABLET | ORAL | 0 refills | Status: AC

## 2024-08-16 NOTE — Progress Notes (Signed)
 "  08/16/2024 2:08 PM   Manuel Murphy 24-Sep-1945 [id number]  Referring provider: Norvell Boyer, PA-C [address] [place],  [place] [address]  Chief Complaint  Patient presents with   Elevated PSA   Urologic history: 1.  Elevated PSA PSA 09/16/2020 was 8.2; repeat 12/26/2020 5.5; 09/17/22 10.0 Twin brother with prostate cancer Biopsy 02/06/2021 with 18 g prostate; 12/12 cores benign MR fusion biopsy 12/02/2022: PSA 10 w/ PI-RADS 4 lesion right anterior transition zone; 12/12 template and pre-/3 ROI biopsies negative for malignancy  HPI: Manuel Murphy is a 79 y.o. male who presents for follow-up  Last office visit was 12/18/2022; no-showed for 12//24 visit No PSAs drawn since that time Reason for his visit today was a referral from his PCP for erectile dysfunction Complains of difficulty achieving and maintaining an erection.  Has been taking low-dose tadalafil  with mild improvement Organic risk factors include hypertension and antihypertensive medications including metoprolol   PSA trend   Prostate Specific Ag, Serum  Latest Ref Rng 0.0 - 4.0 ng/mL  12/26/2020 5.5 (H)   09/17/2022 10.0 (H)     PMH: Past Medical History:  Diagnosis Date   CHF (congestive heart failure) (HCC)    Chronic respiratory failure (HCC)    COPD (chronic obstructive pulmonary disease) (HCC)    GERD (gastroesophageal reflux disease)    Glaucoma    Hypertension     Surgical History: Past Surgical History:  Procedure Laterality Date   COLONOSCOPY WITH PROPOFOL  N/A 05/30/2019   Procedure: COLONOSCOPY WITH PROPOFOL ;  Surgeon: Jinny Carmine, MD;  Location: [facility];  Service: Endoscopy;  Laterality: N/A;   JOINT REPLACEMENT     TOTAL KNEE ARTHROPLASTY      Home Medications:  Allergies as of 08/16/2024       Reactions   2,4-d Dimethylamine    Other reaction(s): Hallucination   Limonene    Other reaction(s): Hallucination Other reaction(s): Hallucination   Morphine And Codeine Anaphylaxis         Medication List        Accurate as of August 16, 2024  2:08 PM. If you have any questions, ask your nurse or doctor.          albuterol  108 (90 Base) MCG/ACT inhaler Commonly known as: VENTOLIN  HFA Inhale 1-2 puffs into the lungs every 6 (six) hours as needed. For wheezing   Alphagan  P 0.15 % ophthalmic solution Generic drug: brimonidine  Place 1 drop into both eyes daily.   amLODipine  10 MG tablet Commonly known as: NORVASC  Take 1 tablet by mouth daily.   aspirin  EC 81 MG tablet Take 81 mg by mouth daily.   atorvastatin  40 MG tablet Commonly known as: LIPITOR Take 40 mg by mouth daily.   brimonidine -timolol 0.2-0.5 % ophthalmic solution Commonly known as: COMBIGAN Apply 1 drop to eye 2 (two) times daily.   chlorhexidine  0.12 % solution Commonly known as: Peridex  Use as directed 15 mLs in the mouth or throat 2 (two) times daily.   dorzolamide 2 % ophthalmic solution Commonly known as: TRUSOPT Apply to eye.   fluticasone 50 MCG/ACT nasal spray Commonly known as: FLONASE Place into both nostrils.   furosemide  40 MG tablet Commonly known as: LASIX  Take 40 mg by mouth daily.   latanoprost  0.005 % ophthalmic solution Commonly known as: XALATAN  Place 1 drop into both eyes at bedtime.   losartan 50 MG tablet Commonly known as: COZAAR Take 50 mg by mouth 2 (two) times daily.   meloxicam 15 MG  tablet Commonly known as: MOBIC Take 1 tablet by mouth daily.   metoprolol  succinate 25 MG 24 hr tablet Commonly known as: TOPROL -XL Take 0.5 tablets by mouth daily.   montelukast 10 MG tablet Commonly known as: SINGULAIR Take 1 tablet by mouth daily.   Spiriva  HandiHaler 18 MCG Caps Generic drug: Tiotropium Bromide  Place 18 mcg into inhaler and inhale daily.   Symbicort  160-4.5 MCG/ACT inhaler Generic drug: budesonide -formoterol  Inhale 2 puffs into the lungs 2 (two) times daily.   tadalafil  5 MG tablet Commonly known as: CIALIS  TADALAFIL  5 MG  TABS   tamsulosin 0.4 MG Caps capsule Commonly known as: FLOMAX Take by mouth daily.        Allergies: Allergies[1]  Family History: History reviewed. No pertinent family history.  Social History:  reports that he has quit smoking. He has been exposed to tobacco smoke. He has quit using smokeless tobacco. He reports that he does not currently use alcohol. He reports that he does not use drugs.   Physical Exam: BP (!) 189/95   Pulse 82   Ht 5' 6 (1.676 m)   Wt 193 lb (87.5 kg)   BMI 31.15 kg/m   Constitutional:  Alert, No acute distress. HEENT: [place] AT Respiratory: Normal respiratory effort, no increased work of breathing. Psychiatric: Normal mood and affect.   Assessment & Plan:    1.  Elevated PSA Status post benign prostate biopsies x 2 Past due for a PSA which was drawn today and he will be notified with the results  2.  Erectile dysfunction Continue tadalafil  5 mg daily Add tadalafil  10 mg as a demand dose 30-60 minutes prior to intercourse   Glendia JAYSON Barba, MD  [facility] [address] [place], [place] [address] [phone]     [1]  Allergies Allergen Reactions   2,4-D Dimethylamine     Other reaction(s): Hallucination   Limonene     Other reaction(s): Hallucination Other reaction(s): Hallucination   Morphine And Codeine Anaphylaxis   "

## 2024-08-16 NOTE — Progress Notes (Signed)
 Patient presents for an office visit. BP today is High. Greater than 140/90. Provider  notified and recheck Blood Pressure .  Pt advised to talk with PCP.  Pt voiced understanding.

## 2024-08-17 ENCOUNTER — Ambulatory Visit: Payer: Self-pay | Admitting: Urology

## 2024-08-17 LAB — PSA: Prostate Specific Ag, Serum: 17.9 ng/mL — ABNORMAL HIGH (ref 0.0–4.0)

## 2024-08-18 ENCOUNTER — Other Ambulatory Visit: Payer: Self-pay | Admitting: *Deleted

## 2024-08-18 DIAGNOSIS — R972 Elevated prostate specific antigen [PSA]: Secondary | ICD-10-CM

## 2024-08-18 NOTE — Progress Notes (Signed)
 Notified patient as instructed, patient  gave number to scheduling for him to call. Went over instructions.

## 2024-08-29 ENCOUNTER — Ambulatory Visit
